# Patient Record
Sex: Female | Born: 1965 | Race: Black or African American | Hispanic: No | Smoking: Former smoker
Health system: Southern US, Community
[De-identification: ages and names within clinical notes are randomized; demographics above are authoritative.]

## PROBLEM LIST (undated history)

## (undated) DIAGNOSIS — E079 Disorder of thyroid, unspecified: Secondary | ICD-10-CM

## (undated) DIAGNOSIS — M1990 Unspecified osteoarthritis, unspecified site: Secondary | ICD-10-CM

## (undated) DIAGNOSIS — M797 Fibromyalgia: Secondary | ICD-10-CM

## (undated) DIAGNOSIS — E119 Type 2 diabetes mellitus without complications: Secondary | ICD-10-CM

## (undated) DIAGNOSIS — A4902 Methicillin resistant Staphylococcus aureus infection, unspecified site: Secondary | ICD-10-CM

## (undated) HISTORY — PX: CHOLECYSTECTOMY: SHX55

## (undated) HISTORY — PX: TONSILLECTOMY: SUR1361

## (undated) HISTORY — PX: APPENDECTOMY: SHX54

---

## 2005-12-26 ENCOUNTER — Ambulatory Visit (HOSPITAL_BASED_OUTPATIENT_CLINIC_OR_DEPARTMENT_OTHER): Admission: RE | Admit: 2005-12-26 | Discharge: 2005-12-26 | Payer: Self-pay | Admitting: Orthopaedic Surgery

## 2006-02-02 ENCOUNTER — Encounter: Admission: RE | Admit: 2006-02-02 | Discharge: 2006-02-02 | Payer: Self-pay | Admitting: Orthopaedic Surgery

## 2009-08-10 ENCOUNTER — Ambulatory Visit: Payer: Self-pay | Admitting: Diagnostic Radiology

## 2009-08-10 ENCOUNTER — Encounter: Payer: Self-pay | Admitting: Emergency Medicine

## 2009-08-11 ENCOUNTER — Inpatient Hospital Stay (HOSPITAL_COMMUNITY): Admission: EM | Admit: 2009-08-11 | Discharge: 2009-08-13 | Payer: Self-pay | Admitting: Internal Medicine

## 2009-08-11 ENCOUNTER — Encounter (INDEPENDENT_AMBULATORY_CARE_PROVIDER_SITE_OTHER): Payer: Self-pay | Admitting: Internal Medicine

## 2009-08-11 ENCOUNTER — Ambulatory Visit: Payer: Self-pay | Admitting: Cardiology

## 2009-08-11 ENCOUNTER — Ambulatory Visit: Payer: Self-pay | Admitting: Vascular Surgery

## 2010-02-20 ENCOUNTER — Encounter: Payer: Self-pay | Admitting: Orthopaedic Surgery

## 2010-04-17 LAB — COMPREHENSIVE METABOLIC PANEL
ALT: 18 U/L (ref 0–35)
AST: 22 U/L (ref 0–37)
Albumin: 3.7 g/dL (ref 3.5–5.2)
BUN: 12 mg/dL (ref 6–23)
CO2: 27 mEq/L (ref 19–32)
Calcium: 9 mg/dL (ref 8.4–10.5)
Creatinine, Ser: 0.83 mg/dL (ref 0.4–1.2)
GFR calc non Af Amer: >60 mL/min (ref >60)
Total Bilirubin: 0.2 mg/dL — ABNORMAL LOW (ref 0.3–1.2)
Total Protein: 6.7 g/dL (ref 6.0–8.3)

## 2010-04-17 LAB — CBC
HCT: 30.9 % — ABNORMAL LOW (ref 36.0–46.0)
HCT: 31.5 % — ABNORMAL LOW (ref 36.0–46.0)
Hemoglobin: 10.1 g/dL — ABNORMAL LOW (ref 12.0–15.0)
Hemoglobin: 10.7 g/dL — ABNORMAL LOW (ref 12.0–15.0)
MCH: 29.6 pg (ref 26.0–34.0)
MCHC: 32.5 g/dL (ref 30.0–36.0)
RDW: 13.6 % (ref 11.5–15.5)
WBC: 5.6 10*3/uL (ref 4.0–10.5)

## 2010-04-17 LAB — GLUCOSE, CAPILLARY
Glucose-Capillary: 104 mg/dL — ABNORMAL HIGH (ref 70–99)
Glucose-Capillary: 112 mg/dL — ABNORMAL HIGH (ref 70–99)

## 2010-04-17 LAB — BASIC METABOLIC PANEL
BUN: 14 mg/dL (ref 6–23)
CO2: 30 mEq/L (ref 19–32)
Creatinine, Ser: 0.9 mg/dL (ref 0.4–1.2)
GFR calc Af Amer: >60 mL/min (ref >60)
GFR calc non Af Amer: >60 mL/min (ref >60)

## 2010-04-17 LAB — LIPID PANEL
HDL: 45 mg/dL (ref >39)
LDL Cholesterol: 80 mg/dL (ref 0–99)
Total CHOL/HDL Ratio: 3.2 RATIO
Triglycerides: 101 mg/dL (ref <150)
VLDL: 20 mg/dL (ref 0–40)

## 2010-04-17 LAB — LUPUS ANTICOAGULANT PANEL
DRVVT: 43.1 secs (ref 36.2–44.3)
Lupus Anticoagulant: NOT DETECTED
PTT Lupus Anticoagulant: 40.7 secs (ref 30.0–45.6)

## 2010-04-17 LAB — CARDIAC PANEL(CRET KIN+CKTOT+MB+TROPI)
CK, MB: 1 ng/mL (ref 0.3–4.0)
Relative Index: INVALID (ref 0.0–2.5)
Total CK: 90 U/L (ref 7–177)
Troponin I: <0.01 ng/mL (ref 0.00–0.06)

## 2010-04-17 LAB — DIFFERENTIAL
Basophils Absolute: 0.1 10*3/uL (ref 0.0–0.1)
Basophils Relative: 1 % (ref 0–1)
Eosinophils Absolute: 0 10*3/uL (ref 0.0–0.7)
Eosinophils Relative: 0 % (ref 0–5)
Lymphocytes Relative: 33 % (ref 12–46)
Neutro Abs: 4.8 10*3/uL (ref 1.7–7.7)
Neutrophils Relative %: 60 % (ref 43–77)

## 2010-04-17 LAB — POCT TOXICOLOGY PANEL

## 2010-04-17 LAB — URINALYSIS, ROUTINE W REFLEX MICROSCOPIC
Glucose, UA: NEGATIVE mg/dL
Nitrite: NEGATIVE
Protein, ur: NEGATIVE mg/dL
Urobilinogen, UA: 1 mg/dL (ref 0.0–1.0)
pH: 7 (ref 5.0–8.0)

## 2010-04-17 LAB — ETHANOL
Alcohol, Ethyl (B): <10 mg/dL (ref 0–10)
Alcohol, Ethyl (B): <5 mg/dL (ref 0–10)

## 2010-04-17 LAB — PROTIME-INR
INR: 1.03 (ref 0.00–1.49)
Prothrombin Time: 13.2 seconds (ref 11.6–15.2)

## 2010-04-17 LAB — HEMOGLOBIN A1C: Mean Plasma Glucose: 111 mg/dL (ref <117)

## 2010-04-17 LAB — POCT CARDIAC MARKERS
CKMB, poc: <1 ng/mL — ABNORMAL LOW (ref 1.0–8.0)
Myoglobin, poc: 35.9 ng/mL (ref 12–200)

## 2010-04-17 LAB — FOLATE RBC: RBC Folate: 549 ng/mL (ref 180–600)

## 2010-04-17 LAB — RPR: RPR Ser Ql: NONREACTIVE

## 2010-04-17 LAB — APTT
aPTT: 32 seconds (ref 24–37)
aPTT: 32 seconds (ref 24–37)

## 2010-04-17 LAB — PROTEIN C ACTIVITY: Protein C Activity: 185 % — ABNORMAL HIGH (ref 75–133)

## 2010-04-17 LAB — PROTEIN C, TOTAL: Protein C, Total: 110 % (ref 70–140)

## 2010-04-17 LAB — ANTITHROMBIN III: AntiThromb III Func: 95 % (ref 76–126)

## 2010-04-17 LAB — FERRITIN: Ferritin: 86 ng/mL (ref 10–291)

## 2010-04-17 LAB — VITAMIN B12: Vitamin B-12: 311 pg/mL (ref 211–911)

## 2010-04-17 LAB — PROTEIN S ACTIVITY: Protein S Activity: 72 % (ref 69–129)

## 2010-04-17 LAB — BETA-2-GLYCOPROTEIN I ABS, IGG/M/A: Beta-2-Glycoprotein I IgM: 0 M Units (ref <20)

## 2010-04-17 LAB — HIV ANTIBODY (ROUTINE TESTING W REFLEX): HIV: NONREACTIVE

## 2010-04-17 LAB — FACTOR 5 LEIDEN

## 2010-04-17 LAB — HOMOCYSTEINE: Homocysteine: 8.1 umol/L (ref 4.0–15.4)

## 2010-12-04 ENCOUNTER — Encounter: Payer: Self-pay | Admitting: *Deleted

## 2010-12-04 ENCOUNTER — Emergency Department (HOSPITAL_BASED_OUTPATIENT_CLINIC_OR_DEPARTMENT_OTHER)
Admission: EM | Admit: 2010-12-04 | Discharge: 2010-12-04 | Disposition: A | Discharge status: Home / Self Care | Payer: Medicaid Other | Attending: Emergency Medicine | Admitting: Emergency Medicine

## 2010-12-04 DIAGNOSIS — L02219 Cutaneous abscess of trunk, unspecified: Secondary | ICD-10-CM | POA: Insufficient documentation

## 2010-12-04 DIAGNOSIS — L02213 Cutaneous abscess of chest wall: Secondary | ICD-10-CM

## 2010-12-04 DIAGNOSIS — IMO0001 Reserved for inherently not codable concepts without codable children: Secondary | ICD-10-CM | POA: Insufficient documentation

## 2010-12-04 DIAGNOSIS — E079 Disorder of thyroid, unspecified: Secondary | ICD-10-CM | POA: Insufficient documentation

## 2010-12-04 DIAGNOSIS — Z8739 Personal history of other diseases of the musculoskeletal system and connective tissue: Secondary | ICD-10-CM | POA: Insufficient documentation

## 2010-12-04 DIAGNOSIS — L03319 Cellulitis of trunk, unspecified: Secondary | ICD-10-CM | POA: Insufficient documentation

## 2010-12-04 DIAGNOSIS — Z79899 Other long term (current) drug therapy: Secondary | ICD-10-CM | POA: Insufficient documentation

## 2010-12-04 HISTORY — DX: Disorder of thyroid, unspecified: E07.9

## 2010-12-04 HISTORY — DX: Unspecified osteoarthritis, unspecified site: M19.90

## 2010-12-04 HISTORY — DX: Fibromyalgia: M79.7

## 2010-12-04 MED ORDER — OXYCODONE-ACETAMINOPHEN 5-325 MG PO TABS
2.0000 | ORAL_TABLET | Freq: Once | ORAL | Status: AC
  Administered 2010-12-04: 2 via ORAL
  Filled 2010-12-04: qty 2

## 2010-12-04 MED ORDER — OXYCODONE-ACETAMINOPHEN 5-325 MG PO TABS: 2.0000 | ORAL_TABLET | ORAL | Status: AC | PRN

## 2010-12-04 NOTE — ED Notes (Signed)
Pt states she noticed a bump in her left axillary region on Monday which gradually got bigger. Saw Dr. And was placed on Avelox. Told if not better to see PCP which she did on Friday and they lanced it. MRSA culture done. Now has increased pain and swelling

## 2010-12-04 NOTE — ED Provider Notes (Signed)
History     CSN: [id number] Arrival date & time: 12/04/2010  9:25 PM   First MD Initiated Contact with Patient 12/04/10 2157      Chief Complaint  Patient presents with  . Abscess    (Consider location/radiation/quality/duration/timing/severity/associated sxs/prior treatment) Patient is a 45 y.o. female presenting with abscess. The history is provided by the patient. No language interpreter was used.  Abscess  This is a recurrent problem. The problem occurs frequently. The problem has been gradually worsening. The abscess is present on the back. The problem is moderate. The abscess is characterized by redness and painfulness. The abscess first occurred at home. Her past medical history is significant for skin abscesses in family. There were no sick contacts.  Pt reports she saw her MD on Friday and had abscess drained.  She reports area has swollen up again.     Past Medical History  Diagnosis Date  . Thyroid disease   . Gout   . Arthritis   . Fibromyalgia     Past Surgical History  Procedure Date  . Appendectomy   . Cholecystectomy   . Tonsillectomy     History reviewed. No pertinent family history.  History  Substance Use Topics  . Smoking status: Never Smoker   . Smokeless tobacco: Not on file  . Alcohol Use: No    OB History    Grav Para Term Preterm Abortions TAB SAB Ect Mult Living                  Review of Systems  Skin: Positive for wound.  All other systems reviewed and are negative.    Allergies  Ultracet and Ultram  Home Medications   Current Outpatient Rx  Name Route Sig Dispense Refill  . AMLODIPINE BESYLATE PO Oral Take 1 tablet by mouth daily.      . CYCLOBENZAPRINE HCL 10 MG PO TABS Oral Take 10 mg by mouth 3 (three) times daily as needed. For muscle spasms      . DULOXETINE HCL 60 MG PO CPEP Oral Take 60 mg by mouth daily.      Marland Kitchen FERROUS SULFATE 325 (65 FE) MG PO TABS Oral Take 325 mg by mouth daily.      Marland Kitchen LASIX PO Oral Take 1  tablet by mouth daily.      Marland Kitchen HYDROCODONE-ACETAMINOPHEN 5-500 MG PO TABS Oral Take 1 tablet by mouth every 4 (four) hours as needed. For pain      . LANSOPRAZOLE 30 MG PO CPDR Oral Take 60 mg by mouth daily.      Marland Kitchen LEVOTHYROXINE SODIUM 175 MCG PO TABS Oral Take 175 mcg by mouth daily.      Marland Kitchen METOCLOPRAMIDE HCL 10 MG PO TABS Oral Take 10 mg by mouth daily.      Marland Kitchen PHENTERMINE HCL 37.5 MG PO CAPS Oral Take by mouth every morning.      Marland Kitchen POTASSIUM CHLORIDE CR PO Oral Take 1 tablet by mouth daily.      Marland Kitchen PROBENECID 500 MG PO TABS Oral Take 500 mg by mouth 2 (two) times daily.      . SULFAMETHOXAZOLE-TMP DS 800-160 MG PO TABS Oral Take 1 tablet by mouth 2 (two) times daily.      . ALBUTEROL SULFATE HFA 108 (90 BASE) MCG/ACT IN AERS Inhalation Inhale 2 puffs into the lungs every 6 (six) hours as needed. For wheezing or shortness of breath       BP 117/62  Pulse  92  Temp(Src) 99.2 F (37.3 C) (Oral)  Resp 20  Ht 5\' 6"  (1.676 m)  Wt 236 lb (107.049 kg)  BMI 38.09 kg/m2  SpO2 100%  Physical Exam  Nursing note and vitals reviewed. Constitutional: She appears well-developed and well-nourished.  HENT:  Head: Normocephalic.  Skin: Skin is warm. There is erythema.  Psychiatric: She has a normal mood and affect.  12x15 cm area of erythema   ED Course  Procedures (including critical care time)  Labs Reviewed - No data to display No results found.   No diagnosis found.    MDM   Pt refuses I and D.  Pt reports she wants to be put to sleep in order to have drained.  I advised pt I can refer her to surgeon.  (probably is a good idea given first I and D incision has closed)       Langston Masker, [place] 12/04/10 2300

## 2010-12-05 NOTE — ED Provider Notes (Signed)
Medical screening examination/treatment/procedure(s) were performed by non-physician practitioner and as supervising physician I was immediately available for consultation/collaboration.   Toy Baker, MD 12/05/10 [phone]

## 2011-03-11 ENCOUNTER — Emergency Department (HOSPITAL_COMMUNITY): Payer: Medicaid Other

## 2011-03-11 ENCOUNTER — Encounter (HOSPITAL_COMMUNITY): Payer: Self-pay

## 2011-03-11 ENCOUNTER — Emergency Department (HOSPITAL_COMMUNITY)
Admission: EM | Admit: 2011-03-11 | Discharge: 2011-03-12 | Disposition: A | Discharge status: Home / Self Care | Payer: Medicaid Other | Attending: Emergency Medicine | Admitting: Emergency Medicine

## 2011-03-11 DIAGNOSIS — Z862 Personal history of diseases of the blood and blood-forming organs and certain disorders involving the immune mechanism: Secondary | ICD-10-CM | POA: Insufficient documentation

## 2011-03-11 DIAGNOSIS — M545 Low back pain, unspecified: Secondary | ICD-10-CM | POA: Insufficient documentation

## 2011-03-11 DIAGNOSIS — Z79899 Other long term (current) drug therapy: Secondary | ICD-10-CM | POA: Insufficient documentation

## 2011-03-11 DIAGNOSIS — M129 Arthropathy, unspecified: Secondary | ICD-10-CM | POA: Insufficient documentation

## 2011-03-11 DIAGNOSIS — M25559 Pain in unspecified hip: Secondary | ICD-10-CM | POA: Insufficient documentation

## 2011-03-11 DIAGNOSIS — IMO0001 Reserved for inherently not codable concepts without codable children: Secondary | ICD-10-CM | POA: Insufficient documentation

## 2011-03-11 DIAGNOSIS — Z8639 Personal history of other endocrine, nutritional and metabolic disease: Secondary | ICD-10-CM | POA: Insufficient documentation

## 2011-03-11 DIAGNOSIS — M549 Dorsalgia, unspecified: Secondary | ICD-10-CM

## 2011-03-11 DIAGNOSIS — E079 Disorder of thyroid, unspecified: Secondary | ICD-10-CM | POA: Insufficient documentation

## 2011-03-11 MED ORDER — HYDROMORPHONE HCL PF 1 MG/ML IJ SOLN
1.0000 mg | Freq: Once | INTRAMUSCULAR | Status: AC
  Administered 2011-03-12: 1 mg via INTRAMUSCULAR
  Filled 2011-03-11: qty 1

## 2011-03-11 MED ORDER — CYCLOBENZAPRINE HCL 10 MG PO TABS
5.0000 mg | ORAL_TABLET | Freq: Once | ORAL | Status: AC
  Administered 2011-03-12: 5 mg via ORAL
  Filled 2011-03-11: qty 1

## 2011-03-11 NOTE — ED Notes (Signed)
Pt c/o back pain after catching her friend - her friend is approx 170 lbs

## 2011-03-12 MED ORDER — CYCLOBENZAPRINE HCL 10 MG PO TABS: 10.0000 mg | ORAL_TABLET | Freq: Two times a day (BID) | ORAL | Status: AC | PRN

## 2011-03-12 MED ORDER — IBUPROFEN 800 MG PO TABS: 800.0000 mg | ORAL_TABLET | Freq: Three times a day (TID) | ORAL | Status: AC

## 2011-03-12 NOTE — ED Provider Notes (Signed)
History     CSN: [id number]  Arrival date & time 03/11/11  2228   First MD Initiated Contact with Patient 03/11/11 2305      Chief Complaint  Patient presents with  . Back Pain    (Consider location/radiation/quality/duration/timing/severity/associated sxs/prior treatment) Patient is a 46 y.o. female presenting with back pain. The history is provided by the patient.  Back Pain  This is a new problem. The current episode started 1 to 2 hours ago. The problem occurs constantly. The problem has not changed since onset.Associated with: Is walking with a friend walked into a rail she caught her friend injuring her right lower back. No fall. No weakness or numbness. She does have pain that radiates to her right hip area. The pain is present in the lumbar spine. The quality of the pain is described as stabbing. The pain radiates to the right thigh. The pain is the same all the time. Pertinent negatives include no chest pain, no fever, no numbness, no weight loss, no headaches, no abdominal pain, no bowel incontinence, no perianal numbness, no bladder incontinence, no dysuria, no paresthesias, no paresis, no tingling and no weakness. She has tried nothing for the symptoms.   patient is able to walk okay, though hurts to walk. She has a history of back pain and fibromyalgia. She states she's had symptoms like this in the remote past. Moderate in severity. No other complaints.  Past Medical History  Diagnosis Date  . Thyroid disease   . Gout   . Arthritis   . Fibromyalgia     Past Surgical History  Procedure Date  . Appendectomy   . Cholecystectomy   . Tonsillectomy     No family history on file.  History  Substance Use Topics  . Smoking status: Never Smoker   . Smokeless tobacco: Not on file  . Alcohol Use: No    OB History    Grav Para Term Preterm Abortions TAB SAB Ect Mult Living                  Review of Systems  Constitutional: Negative for fever, chills and weight  loss.  HENT: Negative for neck pain and neck stiffness.   Eyes: Negative for pain.  Respiratory: Negative for shortness of breath.   Cardiovascular: Negative for chest pain.  Gastrointestinal: Negative for abdominal pain and bowel incontinence.  Genitourinary: Negative for bladder incontinence and dysuria.  Musculoskeletal: Positive for back pain. Negative for gait problem.  Skin: Negative for rash.  Neurological: Negative for tingling, weakness, numbness, headaches and paresthesias.  All other systems reviewed and are negative.    Allergies  Ultracet and Ultram  Home Medications   Current Outpatient Rx  Name Route Sig Dispense Refill  . ALBUTEROL SULFATE HFA 108 (90 BASE) MCG/ACT IN AERS Inhalation Inhale 2 puffs into the lungs every 6 (six) hours as needed. For wheezing or shortness of breath     . CYCLOBENZAPRINE HCL 10 MG PO TABS Oral Take 10 mg by mouth 3 (three) times daily as needed. For muscle spasms      . DULOXETINE HCL 60 MG PO CPEP Oral Take 60 mg by mouth daily.      Marland Kitchen FERROUS SULFATE 325 (65 FE) MG PO TABS Oral Take 325 mg by mouth daily.      Marland Kitchen LANSOPRAZOLE 30 MG PO CPDR Oral Take 60 mg by mouth daily.      Marland Kitchen LEVOTHYROXINE SODIUM 175 MCG PO TABS Oral Take 175  mcg by mouth daily.      Marland Kitchen METOCLOPRAMIDE HCL 10 MG PO TABS Oral Take 10 mg by mouth daily.      Marland Kitchen PHENTERMINE HCL 37.5 MG PO CAPS Oral Take 37.5 mg by mouth every morning.     Marland Kitchen PROBENECID 500 MG PO TABS Oral Take 500 mg by mouth 2 (two) times daily.        BP 136/84  Pulse 83  Temp(Src) 98.6 F (37 C) (Oral)  Resp 18  SpO2 98%  Physical Exam  Constitutional: She is oriented to person, place, and time. She appears well-developed and well-nourished.  HENT:  Head: Normocephalic and atraumatic.  Eyes: Conjunctivae and EOM are normal. Pupils are equal, round, and reactive to light.  Neck: Trachea normal. Neck supple. No thyromegaly present.  Cardiovascular: Normal rate, regular rhythm, S1 normal, S2  normal and normal pulses.     No systolic murmur is present   No diastolic murmur is present  Pulses:      Radial pulses are 2+ on the right side, and 2+ on the left side.  Pulmonary/Chest: Effort normal and breath sounds normal. She has no wheezes. She has no rhonchi. She has no rales. She exhibits no tenderness.  Abdominal: Soft. Normal appearance and bowel sounds are normal. There is no tenderness. There is no CVA tenderness and negative Murphy's sign.  Musculoskeletal:       No midline deformity or step off. Mild lower lumbar tenderness and right paralumbar tenderness. No lower extremity deficits with equal DTRs, strengths and sensorium to light touch.  BLE:s Calves nontender, no cords or erythema, negative Homans sign  Neurological: She is alert and oriented to person, place, and time. She has normal strength. No cranial nerve deficit or sensory deficit. GCS eye subscore is 4. GCS verbal subscore is 5. GCS motor subscore is 6.  Skin: Skin is warm and dry. No rash noted. She is not diaphoretic.  Psychiatric: Her speech is normal.       Cooperative and appropriate    ED Course  Procedures (including critical care time)  Labs Reviewed - No data to display Dg Lumbar Spine Complete  03/12/2011  *RADIOLOGY REPORT*  Clinical Data: Lower back pain after injury.  History of cortisone injections for back pain.  LUMBAR SPINE - COMPLETE 4+ VIEW  Comparison: None.  Findings: There are five non-rib bearing vertebral bodies.  There is normal alignment.  There is no evidence for acute fracture or subluxation.  No spondylolysis or spondylolisthesis identified. No significant degenerative changes identified. The visualized portion of the pelvis has a normal appearance.  Visualized bowel gas pattern is nonobstructive.  IMPRESSION: Negative exam.  Original Report Authenticated By: Patterson Hammersmith, M.D.    IM Dilaudid. Ice.  Recheck 12:40 AM improved, no change on normal neuro exam.   MDM   Right  lower back pain likely musculoskeletal. Patient has primary care followup as needed and states understanding all discharge and followup instructions. Back pain precautions verbalized is understood. No indication for emergent MRI at this time.        Sunnie Nielsen, MD 03/12/11 [phone]

## 2011-03-12 NOTE — ED Notes (Signed)
Patient getting dressed and ready for discharge.

## 2011-08-01 ENCOUNTER — Other Ambulatory Visit (HOSPITAL_BASED_OUTPATIENT_CLINIC_OR_DEPARTMENT_OTHER): Payer: Self-pay | Admitting: Nurse Practitioner

## 2011-08-01 DIAGNOSIS — R52 Pain, unspecified: Secondary | ICD-10-CM

## 2011-08-05 ENCOUNTER — Ambulatory Visit (HOSPITAL_BASED_OUTPATIENT_CLINIC_OR_DEPARTMENT_OTHER)
Admission: RE | Admit: 2011-08-05 | Discharge: 2011-08-05 | Disposition: A | Discharge status: Home / Self Care | Payer: Medicaid Other | Source: Ambulatory Visit | Attending: Nurse Practitioner | Admitting: Nurse Practitioner

## 2011-08-05 ENCOUNTER — Inpatient Hospital Stay (HOSPITAL_BASED_OUTPATIENT_CLINIC_OR_DEPARTMENT_OTHER): Admission: RE | Admit: 2011-08-05 | Payer: Medicaid Other | Source: Ambulatory Visit

## 2011-08-05 DIAGNOSIS — M25569 Pain in unspecified knee: Secondary | ICD-10-CM | POA: Insufficient documentation

## 2011-08-05 DIAGNOSIS — R52 Pain, unspecified: Secondary | ICD-10-CM | POA: Insufficient documentation

## 2011-08-05 DIAGNOSIS — M25469 Effusion, unspecified knee: Secondary | ICD-10-CM | POA: Insufficient documentation

## 2011-08-05 DIAGNOSIS — M169 Osteoarthritis of hip, unspecified: Secondary | ICD-10-CM | POA: Insufficient documentation

## 2011-08-05 DIAGNOSIS — M1610 Unilateral primary osteoarthritis, unspecified hip: Secondary | ICD-10-CM | POA: Insufficient documentation

## 2011-09-23 ENCOUNTER — Emergency Department (HOSPITAL_BASED_OUTPATIENT_CLINIC_OR_DEPARTMENT_OTHER)
Admission: EM | Admit: 2011-09-23 | Discharge: 2011-09-24 | Disposition: A | Discharge status: Home / Self Care | Payer: Medicaid Other | Attending: Emergency Medicine | Admitting: Emergency Medicine

## 2011-09-23 ENCOUNTER — Encounter (HOSPITAL_BASED_OUTPATIENT_CLINIC_OR_DEPARTMENT_OTHER): Payer: Self-pay | Admitting: *Deleted

## 2011-09-23 DIAGNOSIS — M542 Cervicalgia: Secondary | ICD-10-CM | POA: Insufficient documentation

## 2011-09-23 DIAGNOSIS — Z9089 Acquired absence of other organs: Secondary | ICD-10-CM | POA: Insufficient documentation

## 2011-09-23 DIAGNOSIS — T07XXXA Unspecified multiple injuries, initial encounter: Secondary | ICD-10-CM

## 2011-09-23 DIAGNOSIS — Y92009 Unspecified place in unspecified non-institutional (private) residence as the place of occurrence of the external cause: Secondary | ICD-10-CM | POA: Insufficient documentation

## 2011-09-23 DIAGNOSIS — M109 Gout, unspecified: Secondary | ICD-10-CM | POA: Insufficient documentation

## 2011-09-23 DIAGNOSIS — Z79899 Other long term (current) drug therapy: Secondary | ICD-10-CM | POA: Insufficient documentation

## 2011-09-23 DIAGNOSIS — W19XXXA Unspecified fall, initial encounter: Secondary | ICD-10-CM | POA: Insufficient documentation

## 2011-09-23 DIAGNOSIS — R55 Syncope and collapse: Secondary | ICD-10-CM | POA: Insufficient documentation

## 2011-09-23 DIAGNOSIS — R51 Headache: Secondary | ICD-10-CM | POA: Insufficient documentation

## 2011-09-23 DIAGNOSIS — Z8739 Personal history of other diseases of the musculoskeletal system and connective tissue: Secondary | ICD-10-CM | POA: Insufficient documentation

## 2011-09-23 DIAGNOSIS — E079 Disorder of thyroid, unspecified: Secondary | ICD-10-CM | POA: Insufficient documentation

## 2011-09-23 DIAGNOSIS — R079 Chest pain, unspecified: Secondary | ICD-10-CM | POA: Insufficient documentation

## 2011-09-23 DIAGNOSIS — IMO0001 Reserved for inherently not codable concepts without codable children: Secondary | ICD-10-CM | POA: Insufficient documentation

## 2011-09-23 LAB — CBC WITH DIFFERENTIAL/PLATELET
Basophils Relative: 0 % (ref 0–1)
HCT: 30.3 % — ABNORMAL LOW (ref 36.0–46.0)
Hemoglobin: 10 g/dL — ABNORMAL LOW (ref 12.0–15.0)
Lymphocytes Relative: 47 % — ABNORMAL HIGH (ref 12–46)
Lymphs Abs: 3 10*3/uL (ref 0.7–4.0)
MCHC: 33 g/dL (ref 30.0–36.0)
Monocytes Absolute: 0.6 10*3/uL (ref 0.1–1.0)
Monocytes Relative: 9 % (ref 3–12)
Neutro Abs: 2.9 10*3/uL (ref 1.7–7.7)
Neutrophils Relative %: 44 % (ref 43–77)
RBC: 3.56 MIL/uL — ABNORMAL LOW (ref 3.87–5.11)
WBC: 6.5 10*3/uL (ref 4.0–10.5)

## 2011-09-23 LAB — BASIC METABOLIC PANEL
BUN: 18 mg/dL (ref 6–23)
CO2: 25 mEq/L (ref 19–32)
Chloride: 103 mEq/L (ref 96–112)
Creatinine, Ser: 0.8 mg/dL (ref 0.50–1.10)
GFR calc Af Amer: >90 mL/min (ref >90)
Glucose, Bld: 100 mg/dL — ABNORMAL HIGH (ref 70–99)
Potassium: 3.2 mEq/L — ABNORMAL LOW (ref 3.5–5.1)

## 2011-09-23 NOTE — ED Provider Notes (Signed)
History  This chart was scribed for Kristine Seamen, MD by Kristine Ramos. This patient was seen in room MH01/MH01 and the patient's care was started at 2225.   CSN: [id number]  Arrival date & time 09/23/11  2225   First MD Initiated Contact with Patient 09/23/11 2311      Chief Complaint  Patient presents with  . Syncope    Patient is a 46 y.o. female presenting with syncope. The history is provided by the patient. No language interpreter was used.  Loss of Consciousness This is a new problem. The current episode started 6 to 12 hours ago. The problem has been resolved. Associated symptoms include chest pain. Pertinent negatives include no abdominal pain, no headaches and no shortness of breath. Nothing aggravates the symptoms. Nothing relieves the symptoms. She has tried nothing (C-collar in place.) for the symptoms.   Kristine Ramos is a 46 y.o. female who presents to the Emergency Department complaining of syncopal episode this PM about 6 hours ago. She does not remember passing out and woke up on the kitchen floor after standing up from watching TV. She states left side of her neck, head and chest are painful. She states had a similar syncopal episode years ago. She denies any emesis today but did feel sick two days ago with nausea, emesis and diarrhea and now feels a little dehydrated.  Past Medical History  Diagnosis Date  . Thyroid disease   . Gout   . Arthritis   . Fibromyalgia     Past Surgical History  Procedure Date  . Appendectomy   . Cholecystectomy   . Tonsillectomy     History reviewed. No pertinent family history.  History  Substance Use Topics  . Smoking status: Never Smoker   . Smokeless tobacco: Not on file  . Alcohol Use: No    OB History    Grav Para Term Preterm Abortions TAB SAB Ect Mult Living                  Review of Systems  Constitutional: Negative for fever and chills.  HENT: Positive for neck pain (Left neck pain. ).   Respiratory:  Negative for shortness of breath.   Cardiovascular: Positive for chest pain and syncope.  Gastrointestinal: Negative for nausea, vomiting and abdominal pain.  Musculoskeletal: Negative for back pain.       Left shoulder pain.   Neurological: Positive for syncope. Negative for weakness and headaches.  All other systems reviewed and are negative.    Allergies  Almond oil; Apple; Avocado; Banana; Cabbage; Carrot; Eggs or egg-derived products; Milk-related compounds; Orange fruit; Peach; Shellfish allergy; Strawberry; Tomato; Yeast-related products; Ultracet; and Ultram  Home Medications   Current Outpatient Rx  Name Route Sig Dispense Refill  . ALBUTEROL SULFATE HFA 108 (90 BASE) MCG/ACT IN AERS Inhalation Inhale 2 puffs into the lungs every 6 (six) hours as needed. For wheezing or shortness of breath     . CYCLOBENZAPRINE HCL 10 MG PO TABS Oral Take 10 mg by mouth 3 (three) times daily as needed. For muscle spasms      . DULOXETINE HCL 60 MG PO CPEP Oral Take 60 mg by mouth daily.      Marland Kitchen EPINEPHRINE 0.3 MG/0.3ML IJ DEVI Intramuscular Inject 0.3 mg into the muscle once.    [name] (65 FE) MG PO TABS Oral Take 325 mg by mouth daily.      Marland Kitchen LANSOPRAZOLE 30 MG PO CPDR Oral  Take 60 mg by mouth daily.      Marland Kitchen LEVOTHYROXINE SODIUM 175 MCG PO TABS Oral Take 175 mcg by mouth daily.      Marland Kitchen METOCLOPRAMIDE HCL 10 MG PO TABS Oral Take 10 mg by mouth daily.      Marland Kitchen PHENTERMINE HCL 37.5 MG PO TABS Oral Take 37.5 mg by mouth daily before breakfast.    . PROBENECID 500 MG PO TABS Oral Take 500 mg by mouth 2 (two) times daily.        Triage Vitals: BP 119/71  Pulse 68  Temp 99.1 F (37.3 C) (Oral)  Resp 20  Ht 5\' 6"  (1.676 m)  Wt 239 lb (108.41 kg)  BMI 38.58 kg/m2  SpO2 99%  Physical Exam  Nursing note and vitals reviewed. Constitutional: She is oriented to person, place, and time. She appears well-developed and well-nourished. No distress.  HENT:  Head: Normocephalic and  atraumatic.  Eyes: Conjunctivae and EOM are normal. Pupils are equal, round, and reactive to light.  Neck: Neck supple. No tracheal deviation present.       C-spine tenderness. C-collar in place.  Cardiovascular: Normal rate, regular rhythm and normal heart sounds.   Pulmonary/Chest: Effort normal and breath sounds normal. No respiratory distress. She has no wheezes. She has no rales. She exhibits tenderness (Left CW tenderness without any crepitance. ).  Abdominal: Soft. Bowel sounds are normal. She exhibits no distension. There is no tenderness. There is no rebound and no guarding.  Musculoskeletal: Normal range of motion. She exhibits tenderness (Left shoulder tenderness without deformity. ). She exhibits no edema.  Neurological: She is alert and oriented to person, place, and time.  Skin: Skin is warm and dry.       Partially avulsed nail right fifth toe.  Psychiatric: She has a normal mood and affect. Her behavior is normal.    ED Course  Procedures (including critical care time) DIAGNOSTIC STUDIES: Oxygen Saturation is 99% on room air, normal by my interpretation.    COORDINATION OF CARE: At 1120 PM Discussed treatment plan with patient which includes EKG, blood work, UA, and heart markers. Patient agrees.      MDM  I personally performed the services described in this documentation, which was scribed in my presence.  The recorded information has been reviewed and considered.  Nursing notes and vitals signs, including pulse oximetry, reviewed.  Summary of this visit's results, reviewed by myself:  Labs:  Results for orders placed during the hospital encounter of 09/23/11  URINALYSIS, ROUTINE W REFLEX MICROSCOPIC      Component Value Range   Color, Urine YELLOW  YELLOW   APPearance CLEAR  CLEAR   Specific Gravity, Urine 1.025  1.005 - 1.030   pH 5.5  5.0 - 8.0   Glucose, UA NEGATIVE  NEGATIVE mg/dL   Hgb urine dipstick NEGATIVE  NEGATIVE   Bilirubin Urine NEGATIVE   NEGATIVE   Ketones, ur NEGATIVE  NEGATIVE mg/dL   Protein, ur NEGATIVE  NEGATIVE mg/dL   Urobilinogen, UA 1.0  0.0 - 1.0 mg/dL   Nitrite NEGATIVE  NEGATIVE   Leukocytes, UA TRACE (*) NEGATIVE  CBC WITH DIFFERENTIAL      Component Value Range   WBC 6.5  4.0 - 10.5 K/uL   RBC 3.56 (*) 3.87 - 5.11 MIL/uL   Hemoglobin 10.0 (*) 12.0 - 15.0 g/dL   HCT 45.4 (*) 09.8 - 11.9 %   MCV 85.1  78.0 - 100.0 fL   MCH 28.1  26.0 - 34.0 pg   MCHC 33.0  30.0 - 36.0 g/dL   RDW 30.8  65.7 - 84.6 %   Platelets 261  150 - 400 K/uL   Neutrophils Relative 44  43 - 77 %   Neutro Abs 2.9  1.7 - 7.7 K/uL   Lymphocytes Relative 47 (*) 12 - 46 %   Lymphs Abs 3.0  0.7 - 4.0 K/uL   Monocytes Relative 9  3 - 12 %   Monocytes Absolute 0.6  0.1 - 1.0 K/uL   Eosinophils Relative 1  0 - 5 %   Eosinophils Absolute 0.0  0.0 - 0.7 K/uL   Basophils Relative 0  0 - 1 %   Basophils Absolute 0.0  0.0 - 0.1 K/uL  BASIC METABOLIC PANEL      Component Value Range   Sodium 140  135 - 145 mEq/L   Potassium 3.2 (*) 3.5 - 5.1 mEq/L   Chloride 103  96 - 112 mEq/L   CO2 25  19 - 32 mEq/L   Glucose, Bld 100 (*) 70 - 99 mg/dL   BUN 18  6 - 23 mg/dL   Creatinine, Ser 9.62  0.50 - 1.10 mg/dL   Calcium 9.4  8.4 - 95.2 mg/dL   GFR calc non Af Amer 86 (*) >90 mL/min   GFR calc Af Amer >90  >90 mL/min  TROPONIN I      Component Value Range   Troponin I <0.30  <0.30 ng/mL  URINE MICROSCOPIC-ADD ON      Component Value Range   Squamous Epithelial / LPF FEW (*) RARE   WBC, UA 3-6  <3 WBC/hpf   RBC / HPF 0-2  <3 RBC/hpf   Bacteria, UA FEW (*) RARE  TROPONIN I      Component Value Range   Troponin I <0.30  <0.30 ng/mL    Imaging Studies: Dg Cervical Spine Complete  09/24/2011  *RADIOLOGY REPORT*  Clinical Data: 46 year old female with syncope.  Pain.  CERVICAL SPINE - COMPLETE 4+ VIEW  Comparison: Cervical MRI 07/25/2007.  Findings: Stable reversal of cervical lordosis.  Prevertebral soft tissue contours remain within normal  limits. Cervicothoracic junction alignment is within normal limits.  Stable disc spaces. Bilateral posterior element alignment is within normal limits.  AP alignment and lung apices within normal limits.  C1-C2 alignment and odontoid within normal limits.  IMPRESSION: Stable. No acute fracture or listhesis identified in the cervical spine.  Ligamentous injury is not excluded.   Original Report Authenticated By: Harley Hallmark, M.D.    Ct Head Wo Contrast  09/24/2011  *RADIOLOGY REPORT*  Clinical Data: 46 year old female syncope.  Pain.  CT HEAD WITHOUT CONTRAST  Technique:  Contiguous axial images were obtained from the base of the skull through the vertex without contrast.  Comparison: Brain MRI 08/11/2009.  Findings: Visualized paranasal sinuses and mastoids are clear. Visualized orbits and scalp soft tissues are within normal limits. No acute osseous abnormality identified.  Cerebral volume is within normal limits for age.  No midline shift, ventriculomegaly, mass effect, evidence of mass lesion, intracranial hemorrhage or evidence of cortically based acute infarction.  Gray-white matter differentiation is within normal limits throughout the brain.  No suspicious intracranial vascular hyperdensity.  IMPRESSION: Stable and normal noncontrast CT appearance of the brain.   Original Report Authenticated By: Harley Hallmark, M.D.      Date: 09/23/2011 10:48 PM  Rate: 70  Rhythm: normal sinus rhythm  QRS Axis: normal  Intervals: normal  ST/T Wave abnormalities: normal  Conduction Disutrbances: none  Narrative Interpretation: LVH  Comparison with previous EKG: none available  2:48 AM Patient has been stable in the ED. No evidence of arrhythmia or other cardiac event. Patient has a primary care physician within she can followup.             Kristine Seamen, MD 09/24/11 [phone]

## 2011-09-23 NOTE — ED Notes (Addendum)
Pt states she was fine earlier today. Was sitting on couch watching TV. Got up to go to the kitchen and woke up on the floor. Now c/o left shoulder, face, neck,head and CP with deep inspiration. C-collar applied at triage. Taken to ED1 in wheelchair.

## 2011-09-24 ENCOUNTER — Emergency Department (HOSPITAL_BASED_OUTPATIENT_CLINIC_OR_DEPARTMENT_OTHER): Payer: Medicaid Other

## 2011-09-24 LAB — URINALYSIS, ROUTINE W REFLEX MICROSCOPIC
Glucose, UA: NEGATIVE mg/dL
Ketones, ur: NEGATIVE mg/dL
Nitrite: NEGATIVE
Specific Gravity, Urine: 1.025 (ref 1.005–1.030)
pH: 5.5 (ref 5.0–8.0)

## 2011-09-24 LAB — URINE MICROSCOPIC-ADD ON

## 2011-09-24 MED ORDER — SODIUM CHLORIDE 0.9 % IV BOLUS (SEPSIS)
1000.0000 mL | Freq: Once | INTRAVENOUS | Status: AC
  Administered 2011-09-24: 1000 mL via INTRAVENOUS

## 2011-09-24 MED ORDER — FENTANYL CITRATE 0.05 MG/ML IJ SOLN
100.0000 ug | Freq: Once | INTRAMUSCULAR | Status: AC
  Administered 2011-09-24: 100 ug via INTRAVENOUS
  Filled 2011-09-24: qty 2

## 2011-09-24 MED ORDER — HYDROCODONE-ACETAMINOPHEN 5-325 MG PO TABS: 1.0000 | ORAL_TABLET | Freq: Four times a day (QID) | ORAL | Status: AC | PRN

## 2011-10-24 ENCOUNTER — Emergency Department (HOSPITAL_BASED_OUTPATIENT_CLINIC_OR_DEPARTMENT_OTHER)
Admission: EM | Admit: 2011-10-24 | Discharge: 2011-10-24 | Disposition: A | Discharge status: Home / Self Care | Payer: Medicaid Other | Attending: Emergency Medicine | Admitting: Emergency Medicine

## 2011-10-24 ENCOUNTER — Encounter (HOSPITAL_BASED_OUTPATIENT_CLINIC_OR_DEPARTMENT_OTHER): Payer: Self-pay

## 2011-10-24 DIAGNOSIS — Z91018 Allergy to other foods: Secondary | ICD-10-CM | POA: Insufficient documentation

## 2011-10-24 DIAGNOSIS — L03317 Cellulitis of buttock: Secondary | ICD-10-CM

## 2011-10-24 DIAGNOSIS — E079 Disorder of thyroid, unspecified: Secondary | ICD-10-CM | POA: Insufficient documentation

## 2011-10-24 DIAGNOSIS — M109 Gout, unspecified: Secondary | ICD-10-CM | POA: Insufficient documentation

## 2011-10-24 DIAGNOSIS — Z91013 Allergy to seafood: Secondary | ICD-10-CM | POA: Insufficient documentation

## 2011-10-24 DIAGNOSIS — M129 Arthropathy, unspecified: Secondary | ICD-10-CM | POA: Insufficient documentation

## 2011-10-24 DIAGNOSIS — IMO0001 Reserved for inherently not codable concepts without codable children: Secondary | ICD-10-CM | POA: Insufficient documentation

## 2011-10-24 DIAGNOSIS — Z8614 Personal history of Methicillin resistant Staphylococcus aureus infection: Secondary | ICD-10-CM | POA: Insufficient documentation

## 2011-10-24 DIAGNOSIS — L0231 Cutaneous abscess of buttock: Secondary | ICD-10-CM | POA: Insufficient documentation

## 2011-10-24 DIAGNOSIS — J330 Polyp of nasal cavity: Secondary | ICD-10-CM | POA: Insufficient documentation

## 2011-10-24 DIAGNOSIS — J339 Nasal polyp, unspecified: Secondary | ICD-10-CM

## 2011-10-24 HISTORY — DX: Methicillin resistant Staphylococcus aureus infection, unspecified site: A49.02

## 2011-10-24 MED ORDER — SULFAMETHOXAZOLE-TRIMETHOPRIM 800-160 MG PO TABS: 2.0000 | ORAL_TABLET | Freq: Two times a day (BID) | ORAL | Status: AC

## 2011-10-24 MED ORDER — CEPHALEXIN 500 MG PO CAPS: 500.0000 mg | ORAL_CAPSULE | Freq: Four times a day (QID) | ORAL | Status: AC

## 2011-10-24 NOTE — ED Notes (Signed)
MD at bedside. 

## 2011-10-24 NOTE — ED Notes (Signed)
C/o bump on the inside of right nostril x 6 days-also c/o "boil" to right buttock x today

## 2011-10-28 NOTE — ED Provider Notes (Signed)
History     CSN: [id number]  Arrival date & time 10/24/11  1906   First MD Initiated Contact with Patient 10/24/11 1957      Chief Complaint  Patient presents with  . Facial Pain  . Abscess    (Consider location/radiation/quality/duration/timing/severity/associated sxs/prior treatment) HPI Comments: Pt comes in with cc of bump to the buttock. She has hx of boils, and her current lesion started few days back. The lesion is painful. No drainage yet. She has no n/v/f/c. No diabetes. Pt also has a right nare lesion that is new, and painful, with no drainage. No hx of polyps. Breathing without any problems.  Patient is a 46 y.o. female presenting with abscess. The history is provided by the patient.  Abscess  Pertinent negatives include no vomiting.    Past Medical History  Diagnosis Date  . Thyroid disease   . Gout   . Arthritis   . Fibromyalgia   . MRSA (methicillin resistant Staphylococcus aureus)     Past Surgical History  Procedure Date  . Appendectomy   . Cholecystectomy   . Tonsillectomy     No family history on file.  History  Substance Use Topics  . Smoking status: Never Smoker   . Smokeless tobacco: Not on file  . Alcohol Use: No    OB History    Grav Para Term Preterm Abortions TAB SAB Ect Mult Living                  Review of Systems  Constitutional: Negative for activity change.  HENT: Negative for neck pain.   Respiratory: Negative for shortness of breath.   Cardiovascular: Negative for chest pain.  Gastrointestinal: Negative for nausea, vomiting and abdominal pain.  Genitourinary: Negative for dysuria.  Skin: Positive for rash.  Neurological: Negative for headaches.    Allergies  Almond oil; Apple; Avocado; Banana; Cabbage; Carrot; Eggs or egg-derived products; Milk-related compounds; Orange fruit; Peach; Shellfish allergy; Strawberry; Tomato; Yeast-related products; Ultracet; and Ultram  Home Medications   Current Outpatient Rx  Name  Route Sig Dispense Refill  . ALBUTEROL SULFATE HFA 108 (90 BASE) MCG/ACT IN AERS Inhalation Inhale 2 puffs into the lungs every 6 (six) hours as needed. For wheezing or shortness of breath     . CEPHALEXIN 500 MG PO CAPS Oral Take 1 capsule (500 mg total) by mouth 4 (four) times daily. 40 capsule 0  . CYCLOBENZAPRINE HCL 10 MG PO TABS Oral Take 10 mg by mouth 3 (three) times daily as needed. For muscle spasms      . DULOXETINE HCL 60 MG PO CPEP Oral Take 60 mg by mouth daily.      Marland Kitchen EPINEPHRINE 0.3 MG/0.3ML IJ DEVI Intramuscular Inject 0.3 mg into the muscle once.    [name] (65 FE) MG PO TABS Oral Take 325 mg by mouth daily.      Marland Kitchen LANSOPRAZOLE 30 MG PO CPDR Oral Take 60 mg by mouth daily.      Marland Kitchen LEVOTHYROXINE SODIUM 175 MCG PO TABS Oral Take 175 mcg by mouth daily.      Marland Kitchen METOCLOPRAMIDE HCL 10 MG PO TABS Oral Take 10 mg by mouth daily.      Marland Kitchen PHENTERMINE HCL 37.5 MG PO TABS Oral Take 37.5 mg by mouth daily before breakfast.    . PROBENECID 500 MG PO TABS Oral Take 500 mg by mouth 2 (two) times daily.      . SULFAMETHOXAZOLE-TRIMETHOPRIM 800-160 MG PO  TABS Oral Take 2 tablets by mouth 2 (two) times daily. 40 tablet 0    BP 136/81  Pulse 75  Temp 98.7 F (37.1 C) (Oral)  Resp 16  Ht 5\' 6"  (1.676 m)  Wt 240 lb (108.863 kg)  BMI 38.74 kg/m2  SpO2 98%  Physical Exam  Nursing note and vitals reviewed. Constitutional: She is oriented to person, place, and time. She appears well-developed and well-nourished.  HENT:  Head: Normocephalic and atraumatic.       Right nare has a small pustule anteriorly  Eyes: EOM are normal. Pupils are equal, round, and reactive to light.  Neck: Neck supple.  Cardiovascular: Normal rate, regular rhythm and normal heart sounds.   No murmur heard. Pulmonary/Chest: Effort normal. No respiratory distress.  Abdominal: Soft. She exhibits no distension. There is no tenderness. There is no rebound and no guarding.  Neurological: She is alert and  oriented to person, place, and time.  Skin: Skin is warm and dry. Rash noted.       Left gluteal area has a 3 cm diameter lesion that is erythematous, but has no fluctuance, and no pustule. There is slight induration.    ED Course  Procedures (including critical care time)  Labs Reviewed - No data to display No results found.   1. Cellulitis and abscess of buttock   2. Nasal polyp       MDM  Pt comes in with nose pain and abscess. The buttock has no clear evidence of abscess. Likely cellulitis, with may be early phlegmon. No ous noticed that can be expressed per exam. Will give AB and advocate warm compresses. The nare has a pustule - no acute concerns for any emergent condition, and she has no signs or symptoms of cavernous sinus thrombosis. Will give AB, pain meds and d./c.        Derwood Kaplan, MD 10/28/11 [phone]

## 2011-12-09 ENCOUNTER — Emergency Department (HOSPITAL_BASED_OUTPATIENT_CLINIC_OR_DEPARTMENT_OTHER)
Admission: EM | Admit: 2011-12-09 | Discharge: 2011-12-09 | Disposition: A | Discharge status: Home / Self Care | Payer: Medicaid Other | Attending: Emergency Medicine | Admitting: Emergency Medicine

## 2011-12-09 ENCOUNTER — Encounter (HOSPITAL_BASED_OUTPATIENT_CLINIC_OR_DEPARTMENT_OTHER): Payer: Self-pay | Admitting: *Deleted

## 2011-12-09 DIAGNOSIS — Z8739 Personal history of other diseases of the musculoskeletal system and connective tissue: Secondary | ICD-10-CM | POA: Insufficient documentation

## 2011-12-09 DIAGNOSIS — E039 Hypothyroidism, unspecified: Secondary | ICD-10-CM | POA: Insufficient documentation

## 2011-12-09 DIAGNOSIS — E119 Type 2 diabetes mellitus without complications: Secondary | ICD-10-CM | POA: Insufficient documentation

## 2011-12-09 DIAGNOSIS — Z8614 Personal history of Methicillin resistant Staphylococcus aureus infection: Secondary | ICD-10-CM | POA: Insufficient documentation

## 2011-12-09 DIAGNOSIS — Z79899 Other long term (current) drug therapy: Secondary | ICD-10-CM | POA: Insufficient documentation

## 2011-12-09 DIAGNOSIS — E7800 Pure hypercholesterolemia, unspecified: Secondary | ICD-10-CM | POA: Insufficient documentation

## 2011-12-09 DIAGNOSIS — R51 Headache: Secondary | ICD-10-CM | POA: Insufficient documentation

## 2011-12-09 HISTORY — DX: Type 2 diabetes mellitus without complications: E11.9

## 2011-12-09 LAB — BASIC METABOLIC PANEL
GFR calc non Af Amer: 66 mL/min — ABNORMAL LOW (ref >90)
Glucose, Bld: 91 mg/dL (ref 70–99)
Potassium: 3.8 mEq/L (ref 3.5–5.1)
Sodium: 141 mEq/L (ref 135–145)

## 2011-12-09 LAB — CBC WITH DIFFERENTIAL/PLATELET
Eosinophils Absolute: 0.1 10*3/uL (ref 0.0–0.7)
Lymphocytes Relative: 48 % — ABNORMAL HIGH (ref 12–46)
Lymphs Abs: 3.4 10*3/uL (ref 0.7–4.0)
Neutrophils Relative %: 46 % (ref 43–77)
Platelets: 248 10*3/uL (ref 150–400)
RBC: 3.48 MIL/uL — ABNORMAL LOW (ref 3.87–5.11)
WBC: 7.2 10*3/uL (ref 4.0–10.5)

## 2011-12-09 LAB — URINALYSIS, ROUTINE W REFLEX MICROSCOPIC
Hgb urine dipstick: NEGATIVE
Leukocytes, UA: NEGATIVE
Nitrite: NEGATIVE
Specific Gravity, Urine: 1.02 (ref 1.005–1.030)
Urobilinogen, UA: 1 mg/dL (ref 0.0–1.0)

## 2011-12-09 MED ORDER — ONDANSETRON HCL 4 MG/2ML IJ SOLN
4.0000 mg | Freq: Once | INTRAMUSCULAR | Status: AC
  Administered 2011-12-09: 4 mg via INTRAVENOUS
  Filled 2011-12-09: qty 2

## 2011-12-09 MED ORDER — KETOROLAC TROMETHAMINE 30 MG/ML IJ SOLN
30.0000 mg | Freq: Once | INTRAMUSCULAR | Status: AC
  Administered 2011-12-09: 30 mg via INTRAVENOUS
  Filled 2011-12-09: qty 1

## 2011-12-09 MED ORDER — SODIUM CHLORIDE 0.9 % IV BOLUS (SEPSIS)
1000.0000 mL | Freq: Once | INTRAVENOUS | Status: AC
  Administered 2011-12-09: 1000 mL via INTRAVENOUS

## 2011-12-09 NOTE — ED Notes (Signed)
I assisted patient to restroom, she gave urine sample which I took to lab for holding.

## 2011-12-09 NOTE — ED Notes (Signed)
D/c home with family to drive 

## 2011-12-09 NOTE — ED Provider Notes (Signed)
History   This chart was scribed for Kristine [name], MD by [name], ED Scribe. The patient was seen in room MH10/MH10 and the patient's care was started at 9:03PM.     CSN: [id number]  Arrival date & time 12/09/11  1904   First MD Initiated Contact with Patient 12/09/11 2103      Chief Complaint  Patient presents with  . Dizziness    (Consider location/radiation/quality/duration/timing/severity/associated sxs/prior treatment) The history is provided by the patient. No language interpreter was used.    Kristine Ramos is a 46 y.o. female , with a hx of tension headache and migraine, who presents to the Emergency Department complaining of progressively worsening, headache, located at the left side of the head and neck, onset yesterday (12/08/11).  Associated symptoms include dizziness. The pt reports she was standing in line at the shopping center yesterday, where she began to experience a sensation of dizziness. The pt characterizes her dizziness as a sensation of feeling as if she is going to pass out. The pt has a hx of headaches, however, this is not the most severe headache she has experienced. The pt has not taken any medications to relieve the headache.  The pt has a hx of spinal stenosis, thyroid disease, gout, arthritis, fibromyalgia, MRSA, diabetes mellitus, hypercholesteremia, appendectomy, cholecystectomy, and tonsillectomy.   The pt denies photophobia, nausea, and fever.  The pt does not smoke or drink alcohol.      Past Medical History  Diagnosis Date  . Thyroid disease   . Gout   . Arthritis   . Fibromyalgia   . MRSA (methicillin resistant Staphylococcus aureus)   . Diabetes mellitus without complication   . Hypercholesteremia     Past Surgical History  Procedure Date  . Appendectomy   . Cholecystectomy   . Tonsillectomy     History reviewed. No pertinent family history.  History  Substance Use Topics  . Smoking status: Never Smoker   .  Smokeless tobacco: Not on file  . Alcohol Use: No    OB History    Grav Para Term Preterm Abortions TAB SAB Ect Mult Living                  Review of Systems  All other systems reviewed and are negative.    Allergies  Almond oil; Apple; Avocado; Banana; Cabbage; Carrot; Eggs or egg-derived products; Milk-related compounds; Orange fruit; Peach; Shellfish allergy; Strawberry; Tomato; Yeast-related products; Ultracet; and Ultram  Home Medications   Current Outpatient Rx  Name  Route  Sig  Dispense  Refill  . OMEPRAZOLE 40 MG PO CPDR   Oral   Take 40 mg by mouth daily.         . ALBUTEROL SULFATE HFA 108 (90 BASE) MCG/ACT IN AERS   Inhalation   Inhale 2 puffs into the lungs every 6 (six) hours as needed. For wheezing or shortness of breath          . CEPHALEXIN 500 MG PO CAPS   Oral   Take 1 capsule (500 mg total) by mouth 4 (four) times daily.   40 capsule   0   . CYCLOBENZAPRINE HCL 10 MG PO TABS   Oral   Take 10 mg by mouth 3 (three) times daily as needed. For muscle spasms           . DULOXETINE HCL 60 MG PO CPEP   Oral   Take 60 mg by mouth daily.           Marland Kitchen  EPINEPHRINE 0.3 MG/0.3ML IJ DEVI   Intramuscular   Inject 0.3 mg into the muscle once.         [name] (65 FE) MG PO TABS   Oral   Take 325 mg by mouth daily.           Marland Kitchen LANSOPRAZOLE 30 MG PO CPDR   Oral   Take 60 mg by mouth daily.           Marland Kitchen LEVOTHYROXINE SODIUM 175 MCG PO TABS   Oral   Take 137 mcg by mouth daily.          Marland Kitchen METOCLOPRAMIDE HCL 10 MG PO TABS   Oral   Take 10 mg by mouth daily.           Marland Kitchen PHENTERMINE HCL 37.5 MG PO TABS   Oral   Take 37.5 mg by mouth daily before breakfast.         . PROBENECID 500 MG PO TABS   Oral   Take 500 mg by mouth 2 (two) times daily.           . SULFAMETHOXAZOLE-TRIMETHOPRIM 800-160 MG PO TABS   Oral   Take 2 tablets by mouth 2 (two) times daily.   40 tablet   0     BP 132/68  Pulse 54  Temp 98.9  F (37.2 C) (Oral)  Resp 20  Ht 5\' 6"  (1.676 m)  Wt 244 lb (110.678 kg)  BMI 39.38 kg/m2  SpO2 100%  Physical Exam  Nursing note and vitals reviewed. Constitutional: She is oriented to person, place, and time. She appears well-developed and well-nourished.  HENT:  Head: Normocephalic and atraumatic.  Eyes: EOM are normal. Pupils are equal, round, and reactive to light.  Neck: Normal range of motion. Neck supple.  Cardiovascular: Normal rate, normal heart sounds and intact distal pulses.   Pulmonary/Chest: Effort normal and breath sounds normal.  Abdominal: Bowel sounds are normal. She exhibits no distension. There is no tenderness.  Musculoskeletal: Normal range of motion. She exhibits no edema and no tenderness.  Neurological: She is alert and oriented to person, place, and time. She has normal strength. No cranial nerve deficit or sensory deficit.  Skin: Skin is warm and dry. No rash noted.  Psychiatric: She has a normal mood and affect.    ED Course  Procedures (including critical care time)  DIAGNOSTIC STUDIES: Oxygen Saturation is 100% on room air, normal by my interpretation.    COORDINATION OF CARE:  9:15 PM- Treatment plan discussed with patient. Pt agrees with treatment.      Results for orders placed during the hospital encounter of 12/09/11  CBC WITH DIFFERENTIAL      Component Value Range   WBC 7.2  4.0 - 10.5 K/uL   RBC 3.48 (*) 3.87 - 5.11 MIL/uL   Hemoglobin 9.6 (*) 12.0 - 15.0 g/dL   HCT 29.5 (*) 62.1 - 30.8 %   MCV 85.3  78.0 - 100.0 fL   MCH 27.6  26.0 - 34.0 pg   MCHC 32.3  30.0 - 36.0 g/dL   RDW 65.7  84.6 - 96.2 %   Platelets 248  150 - 400 K/uL   Neutrophils Relative 46  43 - 77 %   Neutro Abs 3.3  1.7 - 7.7 K/uL   Lymphocytes Relative 48 (*) 12 - 46 %   Lymphs Abs 3.4  0.7 - 4.0 K/uL   Monocytes Relative 6  3 -  12 %   Monocytes Absolute 0.4  0.1 - 1.0 K/uL   Eosinophils Relative 1  0 - 5 %   Eosinophils Absolute 0.1  0.0 - 0.7 K/uL    Basophils Relative 0  0 - 1 %   Basophils Absolute 0.0  0.0 - 0.1 K/uL  BASIC METABOLIC PANEL      Component Value Range   Sodium 141  135 - 145 mEq/L   Potassium 3.8  3.5 - 5.1 mEq/L   Chloride 103  96 - 112 mEq/L   CO2 26  19 - 32 mEq/L   Glucose, Bld 91  70 - 99 mg/dL   BUN 15  6 - 23 mg/dL   Creatinine, Ser 4.09  0.50 - 1.10 mg/dL   Calcium 9.7  8.4 - 81.1 mg/dL   GFR calc non Af Amer 66 (*) >90 mL/min   GFR calc Af Amer 77 (*) >90 mL/min  URINALYSIS, ROUTINE W REFLEX MICROSCOPIC      Component Value Range   Color, Urine YELLOW  YELLOW   APPearance CLEAR  CLEAR   Specific Gravity, Urine 1.020  1.005 - 1.030   pH 7.0  5.0 - 8.0   Glucose, UA NEGATIVE  NEGATIVE mg/dL   Hgb urine dipstick NEGATIVE  NEGATIVE   Bilirubin Urine NEGATIVE  NEGATIVE   Ketones, ur NEGATIVE  NEGATIVE mg/dL   Protein, ur NEGATIVE  NEGATIVE mg/dL   Urobilinogen, UA 1.0  0.0 - 1.0 mg/dL   Nitrite NEGATIVE  NEGATIVE   Leukocytes, UA NEGATIVE  NEGATIVE     No results found.   No diagnosis found.    MDM  Labs unremarkable. Pt feeling better, ready to go home.       I personally performed the services described in this documentation, which was scribed in my presence. The recorded information has been reviewed and is accurate.     Emeril Stille B. Bernette Mayers, MD 12/09/11 2249

## 2011-12-09 NOTE — ED Notes (Signed)
Pt states she was ? Bit by something the day before yest on her left forearm. Began to feel dizzy. Also c/o ear pain, H/A and lips feel numb. No neuro deficits noted.

## 2012-06-12 ENCOUNTER — Encounter (HOSPITAL_BASED_OUTPATIENT_CLINIC_OR_DEPARTMENT_OTHER): Payer: Self-pay

## 2012-06-12 ENCOUNTER — Emergency Department (HOSPITAL_BASED_OUTPATIENT_CLINIC_OR_DEPARTMENT_OTHER): Payer: Self-pay

## 2012-06-12 ENCOUNTER — Emergency Department (HOSPITAL_BASED_OUTPATIENT_CLINIC_OR_DEPARTMENT_OTHER)
Admission: EM | Admit: 2012-06-12 | Discharge: 2012-06-12 | Disposition: A | Discharge status: Home / Self Care | Payer: Self-pay | Attending: Emergency Medicine | Admitting: Emergency Medicine

## 2012-06-12 DIAGNOSIS — R059 Cough, unspecified: Secondary | ICD-10-CM | POA: Insufficient documentation

## 2012-06-12 DIAGNOSIS — Z8614 Personal history of Methicillin resistant Staphylococcus aureus infection: Secondary | ICD-10-CM | POA: Insufficient documentation

## 2012-06-12 DIAGNOSIS — Z87891 Personal history of nicotine dependence: Secondary | ICD-10-CM | POA: Insufficient documentation

## 2012-06-12 DIAGNOSIS — R0609 Other forms of dyspnea: Secondary | ICD-10-CM | POA: Insufficient documentation

## 2012-06-12 DIAGNOSIS — E119 Type 2 diabetes mellitus without complications: Secondary | ICD-10-CM | POA: Insufficient documentation

## 2012-06-12 DIAGNOSIS — R05 Cough: Secondary | ICD-10-CM | POA: Insufficient documentation

## 2012-06-12 DIAGNOSIS — Z79899 Other long term (current) drug therapy: Secondary | ICD-10-CM | POA: Insufficient documentation

## 2012-06-12 DIAGNOSIS — R631 Polydipsia: Secondary | ICD-10-CM | POA: Insufficient documentation

## 2012-06-12 DIAGNOSIS — J029 Acute pharyngitis, unspecified: Secondary | ICD-10-CM | POA: Insufficient documentation

## 2012-06-12 DIAGNOSIS — R0989 Other specified symptoms and signs involving the circulatory and respiratory systems: Secondary | ICD-10-CM | POA: Insufficient documentation

## 2012-06-12 DIAGNOSIS — R51 Headache: Secondary | ICD-10-CM | POA: Insufficient documentation

## 2012-06-12 DIAGNOSIS — Z9089 Acquired absence of other organs: Secondary | ICD-10-CM | POA: Insufficient documentation

## 2012-06-12 DIAGNOSIS — R079 Chest pain, unspecified: Secondary | ICD-10-CM | POA: Insufficient documentation

## 2012-06-12 DIAGNOSIS — IMO0001 Reserved for inherently not codable concepts without codable children: Secondary | ICD-10-CM | POA: Insufficient documentation

## 2012-06-12 DIAGNOSIS — Z8639 Personal history of other endocrine, nutritional and metabolic disease: Secondary | ICD-10-CM | POA: Insufficient documentation

## 2012-06-12 DIAGNOSIS — R509 Fever, unspecified: Secondary | ICD-10-CM | POA: Insufficient documentation

## 2012-06-12 DIAGNOSIS — E039 Hypothyroidism, unspecified: Secondary | ICD-10-CM | POA: Insufficient documentation

## 2012-06-12 DIAGNOSIS — Z862 Personal history of diseases of the blood and blood-forming organs and certain disorders involving the immune mechanism: Secondary | ICD-10-CM | POA: Insufficient documentation

## 2012-06-12 DIAGNOSIS — M129 Arthropathy, unspecified: Secondary | ICD-10-CM | POA: Insufficient documentation

## 2012-06-12 LAB — GLUCOSE, CAPILLARY: Glucose-Capillary: 90 mg/dL (ref 70–99)

## 2012-06-12 MED ORDER — PREDNISONE 10 MG PO TABS
60.0000 mg | ORAL_TABLET | Freq: Once | ORAL | Status: AC
  Administered 2012-06-12: 60 mg via ORAL
  Filled 2012-06-12: qty 1

## 2012-06-12 MED ORDER — IPRATROPIUM BROMIDE 0.02 % IN SOLN
0.5000 mg | Freq: Once | RESPIRATORY_TRACT | Status: AC
  Administered 2012-06-12: 0.5 mg via RESPIRATORY_TRACT
  Filled 2012-06-12: qty 2.5

## 2012-06-12 MED ORDER — PREDNISONE 10 MG PO TABS: 20.0000 mg | ORAL_TABLET | Freq: Every day | ORAL | Status: AC

## 2012-06-12 MED ORDER — ALBUTEROL SULFATE (5 MG/ML) 0.5% IN NEBU
5.0000 mg | INHALATION_SOLUTION | Freq: Once | RESPIRATORY_TRACT | Status: AC
  Administered 2012-06-12: 5 mg via RESPIRATORY_TRACT
  Filled 2012-06-12: qty 1

## 2012-06-12 NOTE — ED Notes (Signed)
Patient preparing for discharge. 

## 2012-06-12 NOTE — ED Notes (Signed)
Cough x 3 weeks-also c/o pain to left foot x 3 months-denies injury-steady gait to tx area with consistent nonprod cough

## 2012-06-12 NOTE — ED Provider Notes (Signed)
History     CSN: [id number]  Arrival date & time 06/12/12  1142   First MD Initiated Contact with Patient 06/12/12 1158      Chief Complaint  Patient presents with  . Cough    (Consider location/radiation/quality/duration/timing/severity/associated sxs/prior treatment) HPI Patient with cough for three weeks.  Began with subjective fever, sore throat, chest hurst with cough and headache.  Continues np cough.  Patient uses inhaler without relief.  Some dyspnea increased thirst and feels dry.  No current pmd.  Past Medical History  Diagnosis Date  . Thyroid disease   . Gout   . Arthritis   . Fibromyalgia   . MRSA (methicillin resistant Staphylococcus aureus)   . Diabetes mellitus without complication   . Hypercholesteremia     Past Surgical History  Procedure Laterality Date  . Appendectomy    . Cholecystectomy    . Tonsillectomy      No family history on file.  History  Substance Use Topics  . Smoking status: Former Games developer  . Smokeless tobacco: Not on file  . Alcohol Use: No    OB History   Grav Para Term Preterm Abortions TAB SAB Ect Mult Living                  Review of Systems  All other systems reviewed and are negative.    Allergies  Almond oil; Apple; Avocado; Banana; Cabbage; Carrot; Eggs or egg-derived products; Milk-related compounds; Orange fruit; Peach; Shellfish allergy; Strawberry; Tomato; Yeast-related products; Ultracet; and Ultram  Home Medications   Current Outpatient Rx  Name  Route  Sig  Dispense  Refill  . albuterol (PROVENTIL HFA;VENTOLIN HFA) 108 (90 BASE) MCG/ACT inhaler   Inhalation   Inhale 2 puffs into the lungs every 6 (six) hours as needed. For wheezing or shortness of breath          . cephALEXin (KEFLEX) 500 MG capsule   Oral   Take 1 capsule (500 mg total) by mouth 4 (four) times daily.   40 capsule   0   . cyclobenzaprine (FLEXERIL) 10 MG tablet   Oral   Take 10 mg by mouth 3 (three) times daily as needed. For  muscle spasms           . DULoxetine (CYMBALTA) 60 MG capsule   Oral   Take 60 mg by mouth daily.           Marland Kitchen EPINEPHrine (EPIPEN) 0.3 mg/0.3 mL DEVI   Intramuscular   Inject 0.3 mg into the muscle once.         . ferrous sulfate 325 (65 FE) MG tablet   Oral   Take 325 mg by mouth daily.           . lansoprazole (PREVACID) 30 MG capsule   Oral   Take 60 mg by mouth daily.           Marland Kitchen levothyroxine (SYNTHROID, LEVOTHROID) 175 MCG tablet   Oral   Take 137 mcg by mouth daily.          . metoCLOPramide (REGLAN) 10 MG tablet   Oral   Take 10 mg by mouth daily.           Marland Kitchen omeprazole (PRILOSEC) 40 MG capsule   Oral   Take 40 mg by mouth daily.         . phentermine (ADIPEX-P) 37.5 MG tablet   Oral   Take 37.5 mg by mouth daily before breakfast.         .  probenecid (BENEMID) 500 MG tablet   Oral   Take 500 mg by mouth 2 (two) times daily.           Marland Kitchen sulfamethoxazole-trimethoprim (SEPTRA DS) 800-160 MG per tablet   Oral   Take 2 tablets by mouth 2 (two) times daily.   40 tablet   0     BP 124/68  Pulse 82  Temp(Src) 98.1 F (36.7 C) (Oral)  Resp 20  SpO2 99%  Physical Exam  Nursing note and vitals reviewed. Constitutional: She is oriented to person, place, and time. She appears well-developed and well-nourished.  HENT:  Head: Normocephalic and atraumatic.  Right Ear: External ear normal.  Left Ear: External ear normal.  Nose: Nose normal.  Mouth/Throat: Oropharynx is clear and moist.  Eyes: Conjunctivae and EOM are normal. Pupils are equal, round, and reactive to light.  Neck: Normal range of motion. Neck supple.  Cardiovascular: Normal rate, regular rhythm, normal heart sounds and intact distal pulses.   Pulmonary/Chest: Effort normal and breath sounds normal.  couging  Abdominal: Soft. Bowel sounds are normal.  Musculoskeletal: Normal range of motion.  Left heel ttp  Neurological: She is alert and oriented to person, place, and  time. She has normal reflexes.  Skin: Skin is warm and dry.  Psychiatric: She has a normal mood and affect. Her behavior is normal. Thought content normal.    ED Course  Procedures (including critical care time)  Labs Reviewed - No data to display No results found.   No diagnosis found.    MDM  Symptoms better after neb and prednisone.  BS pending.  Plan continue inhaler and add prednisone.        Hilario Quarry, MD 06/12/12 [phone]

## 2012-07-16 ENCOUNTER — Emergency Department (HOSPITAL_BASED_OUTPATIENT_CLINIC_OR_DEPARTMENT_OTHER): Payer: Medicaid Other

## 2012-07-16 ENCOUNTER — Encounter (HOSPITAL_BASED_OUTPATIENT_CLINIC_OR_DEPARTMENT_OTHER): Payer: Self-pay

## 2012-07-16 ENCOUNTER — Emergency Department (HOSPITAL_BASED_OUTPATIENT_CLINIC_OR_DEPARTMENT_OTHER)
Admission: EM | Admit: 2012-07-16 | Discharge: 2012-07-16 | Disposition: A | Discharge status: Home / Self Care | Payer: Medicaid Other | Attending: Emergency Medicine | Admitting: Emergency Medicine

## 2012-07-16 DIAGNOSIS — S161XXA Strain of muscle, fascia and tendon at neck level, initial encounter: Secondary | ICD-10-CM

## 2012-07-16 DIAGNOSIS — Z79899 Other long term (current) drug therapy: Secondary | ICD-10-CM | POA: Insufficient documentation

## 2012-07-16 DIAGNOSIS — Y9289 Other specified places as the place of occurrence of the external cause: Secondary | ICD-10-CM | POA: Insufficient documentation

## 2012-07-16 DIAGNOSIS — E119 Type 2 diabetes mellitus without complications: Secondary | ICD-10-CM | POA: Insufficient documentation

## 2012-07-16 DIAGNOSIS — M549 Dorsalgia, unspecified: Secondary | ICD-10-CM

## 2012-07-16 DIAGNOSIS — M109 Gout, unspecified: Secondary | ICD-10-CM | POA: Insufficient documentation

## 2012-07-16 DIAGNOSIS — Y9389 Activity, other specified: Secondary | ICD-10-CM | POA: Insufficient documentation

## 2012-07-16 DIAGNOSIS — S139XXA Sprain of joints and ligaments of unspecified parts of neck, initial encounter: Secondary | ICD-10-CM | POA: Insufficient documentation

## 2012-07-16 DIAGNOSIS — E079 Disorder of thyroid, unspecified: Secondary | ICD-10-CM | POA: Insufficient documentation

## 2012-07-16 DIAGNOSIS — Z8614 Personal history of Methicillin resistant Staphylococcus aureus infection: Secondary | ICD-10-CM | POA: Insufficient documentation

## 2012-07-16 DIAGNOSIS — Z87891 Personal history of nicotine dependence: Secondary | ICD-10-CM | POA: Insufficient documentation

## 2012-07-16 DIAGNOSIS — W010XXA Fall on same level from slipping, tripping and stumbling without subsequent striking against object, initial encounter: Secondary | ICD-10-CM | POA: Insufficient documentation

## 2012-07-16 DIAGNOSIS — Z8739 Personal history of other diseases of the musculoskeletal system and connective tissue: Secondary | ICD-10-CM | POA: Insufficient documentation

## 2012-07-16 DIAGNOSIS — IMO0002 Reserved for concepts with insufficient information to code with codable children: Secondary | ICD-10-CM | POA: Insufficient documentation

## 2012-07-16 MED ORDER — METHOCARBAMOL 500 MG PO TABS: 500.0000 mg | ORAL_TABLET | Freq: Two times a day (BID) | ORAL | Status: AC

## 2012-07-16 MED ORDER — IBUPROFEN 800 MG PO TABS: 800.0000 mg | ORAL_TABLET | Freq: Three times a day (TID) | ORAL | Status: AC

## 2012-07-16 NOTE — ED Notes (Signed)
Tripped over boxes in a store last night-pain to neck, lower back radiating down leg and to right knee

## 2012-07-16 NOTE — ED Provider Notes (Signed)
History     CSN: [id number]  Arrival date & time 07/16/12  1315   First MD Initiated Contact with Patient 07/16/12 1404      Chief Complaint  Patient presents with  . Fall    (Consider location/radiation/quality/duration/timing/severity/associated sxs/prior treatment) Patient is a 47 y.o. female presenting with fall. The history is provided by the patient. No language interpreter was used.  Fall This is a new problem. The current episode started yesterday. The problem has been unchanged. Associated symptoms include myalgias. Pertinent negatives include no joint swelling. Nothing aggravates the symptoms. She has tried nothing for the symptoms. The treatment provided no relief.  Pt reports she was in a store and tripped over a box in the floor.  Pt reports she hit shelves.   No impact of head.   Pt complains of soreness in her left knee, low back to left buttock and down leg.   Pt also complains of pain in her neck.  No impact of neck.   Pt reports twisting and jerking only.   Past Medical History  Diagnosis Date  . Thyroid disease   . Gout   . Arthritis   . Fibromyalgia   . MRSA (methicillin resistant Staphylococcus aureus)   . Diabetes mellitus without complication     Past Surgical History  Procedure Laterality Date  . Appendectomy    . Cholecystectomy    . Tonsillectomy      No family history on file.  History  Substance Use Topics  . Smoking status: Former Games developer  . Smokeless tobacco: Not on file  . Alcohol Use: No    OB History   Grav Para Term Preterm Abortions TAB SAB Ect Mult Living                  Review of Systems  Musculoskeletal: Positive for myalgias and back pain. Negative for joint swelling.  All other systems reviewed and are negative.    Allergies  Almond oil; Apple; Avocado; Banana; Cabbage; Carrot; Eggs or egg-derived products; Milk-related compounds; Orange fruit; Peach; Shellfish allergy; Strawberry; Tomato; Yeast-related products;  Ultracet; and Ultram  Home Medications   Current Outpatient Rx  Name  Route  Sig  Dispense  Refill  . albuterol (PROVENTIL HFA;VENTOLIN HFA) 108 (90 BASE) MCG/ACT inhaler   Inhalation   Inhale 2 puffs into the lungs every 6 (six) hours as needed. For wheezing or shortness of breath          . cephALEXin (KEFLEX) 500 MG capsule   Oral   Take 1 capsule (500 mg total) by mouth 4 (four) times daily.   40 capsule   0   . cyclobenzaprine (FLEXERIL) 10 MG tablet   Oral   Take 10 mg by mouth 3 (three) times daily as needed. For muscle spasms           . DULoxetine (CYMBALTA) 60 MG capsule   Oral   Take 60 mg by mouth daily.           Marland Kitchen EPINEPHrine (EPIPEN) 0.3 mg/0.3 mL DEVI   Intramuscular   Inject 0.3 mg into the muscle once.         . ferrous sulfate 325 (65 FE) MG tablet   Oral   Take 325 mg by mouth daily.           . lansoprazole (PREVACID) 30 MG capsule   Oral   Take 60 mg by mouth daily.           Marland Kitchen  levothyroxine (SYNTHROID, LEVOTHROID) 175 MCG tablet   Oral   Take 137 mcg by mouth daily.          . metoCLOPramide (REGLAN) 10 MG tablet   Oral   Take 10 mg by mouth daily.           Marland Kitchen omeprazole (PRILOSEC) 40 MG capsule   Oral   Take 40 mg by mouth daily.         . phentermine (ADIPEX-P) 37.5 MG tablet   Oral   Take 37.5 mg by mouth daily before breakfast.         . predniSONE (DELTASONE) 10 MG tablet   Oral   Take 2 tablets (20 mg total) by mouth daily.   15 tablet   0   . probenecid (BENEMID) 500 MG tablet   Oral   Take 500 mg by mouth 2 (two) times daily.           Marland Kitchen sulfamethoxazole-trimethoprim (SEPTRA DS) 800-160 MG per tablet   Oral   Take 2 tablets by mouth 2 (two) times daily.   40 tablet   0     BP 140/71  Pulse 78  Temp(Src) 98.9 F (37.2 C) (Oral)  Resp 16  Ht 5\' 6"  (1.676 m)  Wt 245 lb (111.131 kg)  BMI 39.56 kg/m2  SpO2 100%  Physical Exam  Nursing note and vitals reviewed. Constitutional: She appears  well-developed and well-nourished.  HENT:  Head: Normocephalic.  Right Ear: External ear normal.  Left Ear: External ear normal.  Nose: Nose normal.  Mouth/Throat: Oropharynx is clear and moist.  Eyes: Conjunctivae are normal. Pupils are equal, round, and reactive to light.  Neck: Normal range of motion. Neck supple.  Cardiovascular: Normal rate and normal heart sounds.   Pulmonary/Chest: Effort normal and breath sounds normal.  Abdominal: Soft. There is no tenderness. There is no rebound.  Musculoskeletal: She exhibits tenderness.  Tender diffuse neck,  Tender diffuse ls spine,  Into left buttock and down to knee.    Diffusely tender left knee,  No effusion  Neurological: She is alert.  Skin: Skin is warm.    ED Course  Procedures (including critical care time)  Labs Reviewed - No data to display No results found.   No diagnosis found.    MDM   Results for orders placed during the hospital encounter of 06/12/12  GLUCOSE, CAPILLARY      Result Value Range   Glucose-Capillary 90  70 - 99 mg/dL   Comment 1 Documented in Chart     Comment 2 Notify RN     Dg Cervical Spine Complete  07/16/2012   *RADIOLOGY REPORT*  Clinical Data: Left neck pain following a fall last night.  CERVICAL SPINE - COMPLETE 4+ VIEW  Comparison: 09/24/2011.  Findings: Mild reversal of the normal cervical lordosis with some improvement.  Stable anterior spur formation at the C3-4 through C6- 7 levels.  No prevertebral soft tissue swelling, fractures or subluxations.  IMPRESSION:  1.  No fracture or subluxation. 2.  Stable degenerative changes. 3.  Mild reversal of the normal cervical lordosis with improvement. If   Original Report Authenticated By: Beckie Salts, M.D.   Dg Knee Complete 4 Views Left  07/16/2012   *RADIOLOGY REPORT*  Clinical Data: Left knee pain following a fall last night.  LEFT KNEE - COMPLETE 4+ VIEW  Comparison: None.  Findings: Minimal medial and lateral spur formation.  No fracture,  dislocation or effusion.  IMPRESSION:  1.  No fracture. 2.  Minimal degenerative changes.   Original Report Authenticated By: Beckie Salts, M.D.   Probable muscle strain neck and back,  Pt advised follow up with Orthopaedist if pain persist past one week.   Pt given rx for ibuprofen and robaxin for discomfort        Elson Areas, PA-C 07/16/12 1542

## 2012-07-17 NOTE — ED Provider Notes (Signed)
Medical screening examination/treatment/procedure(s) were performed by non-physician practitioner and as supervising physician I was immediately available for consultation/collaboration.   Gwyneth Sprout, MD 07/17/12 [phone]

## 2012-09-22 ENCOUNTER — Encounter (HOSPITAL_BASED_OUTPATIENT_CLINIC_OR_DEPARTMENT_OTHER): Payer: Self-pay | Admitting: *Deleted

## 2012-09-22 ENCOUNTER — Emergency Department (HOSPITAL_BASED_OUTPATIENT_CLINIC_OR_DEPARTMENT_OTHER)
Admission: EM | Admit: 2012-09-22 | Discharge: 2012-09-22 | Disposition: A | Discharge status: Home / Self Care | Payer: Self-pay | Attending: Emergency Medicine | Admitting: Emergency Medicine

## 2012-09-22 DIAGNOSIS — Z87891 Personal history of nicotine dependence: Secondary | ICD-10-CM | POA: Insufficient documentation

## 2012-09-22 DIAGNOSIS — E079 Disorder of thyroid, unspecified: Secondary | ICD-10-CM | POA: Insufficient documentation

## 2012-09-22 DIAGNOSIS — Z79899 Other long term (current) drug therapy: Secondary | ICD-10-CM | POA: Insufficient documentation

## 2012-09-22 DIAGNOSIS — R300 Dysuria: Secondary | ICD-10-CM | POA: Insufficient documentation

## 2012-09-22 DIAGNOSIS — R509 Fever, unspecified: Secondary | ICD-10-CM | POA: Insufficient documentation

## 2012-09-22 DIAGNOSIS — R110 Nausea: Secondary | ICD-10-CM | POA: Insufficient documentation

## 2012-09-22 DIAGNOSIS — Z3202 Encounter for pregnancy test, result negative: Secondary | ICD-10-CM | POA: Insufficient documentation

## 2012-09-22 DIAGNOSIS — Z9089 Acquired absence of other organs: Secondary | ICD-10-CM | POA: Insufficient documentation

## 2012-09-22 DIAGNOSIS — Z862 Personal history of diseases of the blood and blood-forming organs and certain disorders involving the immune mechanism: Secondary | ICD-10-CM | POA: Insufficient documentation

## 2012-09-22 DIAGNOSIS — IMO0002 Reserved for concepts with insufficient information to code with codable children: Secondary | ICD-10-CM | POA: Insufficient documentation

## 2012-09-22 DIAGNOSIS — Z8614 Personal history of Methicillin resistant Staphylococcus aureus infection: Secondary | ICD-10-CM | POA: Insufficient documentation

## 2012-09-22 DIAGNOSIS — N12 Tubulo-interstitial nephritis, not specified as acute or chronic: Secondary | ICD-10-CM | POA: Insufficient documentation

## 2012-09-22 DIAGNOSIS — Z8639 Personal history of other endocrine, nutritional and metabolic disease: Secondary | ICD-10-CM | POA: Insufficient documentation

## 2012-09-22 DIAGNOSIS — E119 Type 2 diabetes mellitus without complications: Secondary | ICD-10-CM | POA: Insufficient documentation

## 2012-09-22 DIAGNOSIS — R109 Unspecified abdominal pain: Secondary | ICD-10-CM | POA: Insufficient documentation

## 2012-09-22 DIAGNOSIS — IMO0001 Reserved for inherently not codable concepts without codable children: Secondary | ICD-10-CM | POA: Insufficient documentation

## 2012-09-22 DIAGNOSIS — R350 Frequency of micturition: Secondary | ICD-10-CM | POA: Insufficient documentation

## 2012-09-22 LAB — URINALYSIS, ROUTINE W REFLEX MICROSCOPIC
Bilirubin Urine: NEGATIVE
Glucose, UA: NEGATIVE mg/dL
Ketones, ur: NEGATIVE mg/dL
Nitrite: POSITIVE — AB
Protein, ur: 100 mg/dL — AB
Specific Gravity, Urine: 1.013 (ref 1.005–1.030)
Urobilinogen, UA: 0.2 mg/dL (ref 0.0–1.0)
pH: 6 (ref 5.0–8.0)

## 2012-09-22 LAB — URINE MICROSCOPIC-ADD ON

## 2012-09-22 LAB — PREGNANCY, URINE: Preg Test, Ur: NEGATIVE

## 2012-09-22 MED ORDER — ONDANSETRON HCL 4 MG/2ML IJ SOLN
4.0000 mg | Freq: Once | INTRAMUSCULAR | Status: DC
  Filled 2012-09-22: qty 2

## 2012-09-22 MED ORDER — HYDROCODONE-ACETAMINOPHEN 7.5-325 MG/15ML PO SOLN: 15.0000 mL | Freq: Four times a day (QID) | ORAL | Status: AC | PRN

## 2012-09-22 MED ORDER — CIPROFLOXACIN HCL 500 MG PO TABS
500.0000 mg | ORAL_TABLET | Freq: Once | ORAL | Status: AC
  Administered 2012-09-22: 500 mg via ORAL
  Filled 2012-09-22: qty 1

## 2012-09-22 MED ORDER — CIPROFLOXACIN HCL 500 MG PO TABS: 500.0000 mg | ORAL_TABLET | Freq: Two times a day (BID) | ORAL | Status: AC

## 2012-09-22 MED ORDER — ONDANSETRON 4 MG PO TBDP
4.0000 mg | ORAL_TABLET | Freq: Once | ORAL | Status: AC
  Administered 2012-09-22: 4 mg via ORAL
  Filled 2012-09-22: qty 1

## 2012-09-22 MED ORDER — MORPHINE SULFATE 4 MG/ML IJ SOLN: 4.0000 mg | Freq: Once | INTRAMUSCULAR | Status: DC

## 2012-09-22 MED ORDER — MORPHINE SULFATE 4 MG/ML IJ SOLN
4.0000 mg | Freq: Once | INTRAMUSCULAR | Status: DC
  Filled 2012-09-22: qty 1

## 2012-09-22 MED ORDER — CIPROFLOXACIN IN D5W 400 MG/200ML IV SOLN
400.0000 mg | Freq: Once | INTRAVENOUS | Status: DC
  Filled 2012-09-22: qty 200

## 2012-09-22 MED ORDER — PROMETHAZINE HCL 25 MG PO TABS: 25.0000 mg | ORAL_TABLET | Freq: Four times a day (QID) | ORAL | Status: AC | PRN

## 2012-09-22 MED ORDER — PROMETHAZINE HCL 25 MG/ML IJ SOLN
25.0000 mg | Freq: Once | INTRAMUSCULAR | Status: AC
  Administered 2012-09-22: 25 mg via INTRAMUSCULAR
  Filled 2012-09-22: qty 1

## 2012-09-22 MED ORDER — SODIUM CHLORIDE 0.9 % IV BOLUS (SEPSIS): 1000.0000 mL | Freq: Once | INTRAVENOUS | Status: DC

## 2012-09-22 MED ORDER — MORPHINE SULFATE 4 MG/ML IJ SOLN
4.0000 mg | Freq: Once | INTRAMUSCULAR | Status: AC
  Administered 2012-09-22: 4 mg via INTRAMUSCULAR

## 2012-09-22 NOTE — ED Notes (Addendum)
Patient with c/o low back pain since Friday.  Patient also is having polyuria and pain upon urination & chills

## 2012-09-22 NOTE — ED Provider Notes (Signed)
CSN: [id number]     Arrival date & time 09/22/12  1651 History     First MD Initiated Contact with Patient 09/22/12 1726     Chief Complaint  Patient presents with  . Back Pain   (Consider location/radiation/quality/duration/timing/severity/associated sxs/prior Treatment) HPI Comments: Patient is a 47 year old female with a past medical history of diabetes and arthritis who presents with a 3 day history of left flank and abdominal pain. The pain is located in her left flank and lower abdomen and does not radiate. The pain is described as aching and severe. The pain started gradually and progressively worsened since the onset. No alleviating/aggravating factors. The patient has tried nothing for symptoms without relief. Associated symptoms include dysuria, urinary frequency, fever, nausea. Patient denies headache, vomiting, diarrhea, chest pain, SOB, constipation, abnormal vaginal bleeding/discharge.     Patient is a 47 y.o. female presenting with back pain.  Back Pain Associated symptoms: dysuria and fever     Past Medical History  Diagnosis Date  . Thyroid disease   . Gout   . Arthritis   . Fibromyalgia   . MRSA (methicillin resistant Staphylococcus aureus)   . Diabetes mellitus without complication    Past Surgical History  Procedure Laterality Date  . Appendectomy    . Cholecystectomy    . Tonsillectomy     No family history on file. History  Substance Use Topics  . Smoking status: Former Games developer  . Smokeless tobacco: Not on file  . Alcohol Use: No   OB History   Grav Para Term Preterm Abortions TAB SAB Ect Mult Living                 Review of Systems  Constitutional: Positive for fever.  Gastrointestinal: Positive for nausea.  Genitourinary: Positive for dysuria, frequency and flank pain.  Musculoskeletal: Positive for back pain.  All other systems reviewed and are negative.    Allergies  Almond oil; Apple; Avocado; Banana; Cabbage; Carrot; Eggs or  egg-derived products; Milk-related compounds; Orange fruit; Peach; Shellfish allergy; Strawberry; Tomato; Yeast-related products; Ultracet; and Ultram  Home Medications   Current Outpatient Rx  Name  Route  Sig  Dispense  Refill  . gabapentin (NEURONTIN) 300 MG capsule   Oral   Take 300 mg by mouth 1 day or 1 dose.         . albuterol (PROVENTIL HFA;VENTOLIN HFA) 108 (90 BASE) MCG/ACT inhaler   Inhalation   Inhale 2 puffs into the lungs every 6 (six) hours as needed. For wheezing or shortness of breath          . cephALEXin (KEFLEX) 500 MG capsule   Oral   Take 1 capsule (500 mg total) by mouth 4 (four) times daily.   40 capsule   0   . cyclobenzaprine (FLEXERIL) 10 MG tablet   Oral   Take 10 mg by mouth 3 (three) times daily as needed. For muscle spasms           . DULoxetine (CYMBALTA) 60 MG capsule   Oral   Take 60 mg by mouth daily.           Marland Kitchen EPINEPHrine (EPIPEN) 0.3 mg/0.3 mL DEVI   Intramuscular   Inject 0.3 mg into the muscle once.         . ferrous sulfate 325 (65 FE) MG tablet   Oral   Take 325 mg by mouth daily.           Marland Kitchen  ibuprofen (ADVIL,MOTRIN) 800 MG tablet   Oral   Take 1 tablet (800 mg total) by mouth 3 (three) times daily.   21 tablet   0   . lansoprazole (PREVACID) 30 MG capsule   Oral   Take 60 mg by mouth daily.           Marland Kitchen levothyroxine (SYNTHROID, LEVOTHROID) 175 MCG tablet   Oral   Take 137 mcg by mouth daily.          . methocarbamol (ROBAXIN) 500 MG tablet   Oral   Take 1 tablet (500 mg total) by mouth 2 (two) times daily.   20 tablet   0   . metoCLOPramide (REGLAN) 10 MG tablet   Oral   Take 10 mg by mouth daily.           Marland Kitchen omeprazole (PRILOSEC) 40 MG capsule   Oral   Take 40 mg by mouth daily.         . phentermine (ADIPEX-P) 37.5 MG tablet   Oral   Take 37.5 mg by mouth daily before breakfast.         . predniSONE (DELTASONE) 10 MG tablet   Oral   Take 2 tablets (20 mg total) by mouth daily.    15 tablet   0   . probenecid (BENEMID) 500 MG tablet   Oral   Take 500 mg by mouth 2 (two) times daily.           Marland Kitchen sulfamethoxazole-trimethoprim (SEPTRA DS) 800-160 MG per tablet   Oral   Take 2 tablets by mouth 2 (two) times daily.   40 tablet   0    BP 159/70  Pulse 86  Temp(Src) 99.3 F (37.4 C) (Oral)  Resp 20  Ht 5\' 6"  (1.676 m)  Wt 239 lb (108.41 kg)  BMI 38.59 kg/m2  SpO2 100% Physical Exam  Nursing note and vitals reviewed. Constitutional: She is oriented to person, place, and time. She appears well-developed and well-nourished. No distress.  HENT:  Head: Normocephalic and atraumatic.  Eyes: Conjunctivae are normal.  Neck: Normal range of motion.  Cardiovascular: Normal rate and regular rhythm.  Exam reveals no gallop and no friction rub.   No murmur heard. Pulmonary/Chest: Effort normal and breath sounds normal. She has no wheezes. She has no rales. She exhibits no tenderness.  Abdominal: Soft. She exhibits no distension. There is tenderness. There is no rebound and no guarding.  Suprapubic tenderness to palpation. No peritoneal signs.   Genitourinary:  Left CVA tenderness.   Musculoskeletal: Normal range of motion.  Neurological: She is alert and oriented to person, place, and time. Coordination normal.  Speech is goal-oriented. Moves limbs without ataxia.   Skin: Skin is warm and dry.  Psychiatric: She has a normal mood and affect. Her behavior is normal.    ED Course   Procedures (including critical care time)  Labs Reviewed  URINALYSIS, ROUTINE W REFLEX MICROSCOPIC - Abnormal; Notable for the following:    APPearance TURBID (*)    Hgb urine dipstick MODERATE (*)    Protein, ur 100 (*)    Nitrite POSITIVE (*)    Leukocytes, UA LARGE (*)    All other components within normal limits  URINE MICROSCOPIC-ADD ON - Abnormal; Notable for the following:    Bacteria, UA FEW (*)    All other components within normal limits  URINE CULTURE  PREGNANCY,  URINE   No results found. 1. Pyelonephritis  MDM  5:31 PM Urinalysis shows UTI. Pregnancy test negative. Patient likely has pyelonephritis. Patient will have PO cipro morphine and zofran. Vitals stable and patient afebrile.   8:55 PM Patient feeling better. I will discharge the patient with Cipro, Vicodin, and Phenergan. Vitals stable and patient afebrile. Patient instructed to return with worsening or concerning symptoms.   Emilia Beck, [place] 09/22/12 2057

## 2012-09-22 NOTE — ED Notes (Signed)
Pt vomited clear liquid 15 minutes after taking Cipro.  No pill or pill fragments found. PA notified.  Plan is to watch pt at this time.

## 2012-09-25 LAB — URINE CULTURE: Colony Count: >=100000

## 2012-09-26 NOTE — ED Notes (Signed)
+   Urine Patient treated with Cipro-sensitive to same-chart appended per protocol MD. 

## 2012-09-26 NOTE — ED Provider Notes (Signed)
Medical screening examination/treatment/procedure(s) were performed by non-physician practitioner and as supervising physician I was immediately available for consultation/collaboration.  Josph Norfleet, MD 09/26/12 0847 

## 2012-09-27 ENCOUNTER — Encounter (HOSPITAL_BASED_OUTPATIENT_CLINIC_OR_DEPARTMENT_OTHER): Payer: Self-pay | Admitting: *Deleted

## 2012-09-27 ENCOUNTER — Emergency Department (HOSPITAL_BASED_OUTPATIENT_CLINIC_OR_DEPARTMENT_OTHER)
Admission: EM | Admit: 2012-09-27 | Discharge: 2012-09-27 | Disposition: A | Discharge status: Home / Self Care | Payer: Self-pay | Attending: Emergency Medicine | Admitting: Emergency Medicine

## 2012-09-27 DIAGNOSIS — M549 Dorsalgia, unspecified: Secondary | ICD-10-CM | POA: Insufficient documentation

## 2012-09-27 DIAGNOSIS — R21 Rash and other nonspecific skin eruption: Secondary | ICD-10-CM | POA: Insufficient documentation

## 2012-09-27 DIAGNOSIS — R109 Unspecified abdominal pain: Secondary | ICD-10-CM | POA: Insufficient documentation

## 2012-09-27 DIAGNOSIS — E079 Disorder of thyroid, unspecified: Secondary | ICD-10-CM | POA: Insufficient documentation

## 2012-09-27 DIAGNOSIS — R350 Frequency of micturition: Secondary | ICD-10-CM | POA: Insufficient documentation

## 2012-09-27 DIAGNOSIS — Z3202 Encounter for pregnancy test, result negative: Secondary | ICD-10-CM | POA: Insufficient documentation

## 2012-09-27 DIAGNOSIS — N289 Disorder of kidney and ureter, unspecified: Secondary | ICD-10-CM | POA: Insufficient documentation

## 2012-09-27 DIAGNOSIS — M109 Gout, unspecified: Secondary | ICD-10-CM | POA: Insufficient documentation

## 2012-09-27 DIAGNOSIS — M25519 Pain in unspecified shoulder: Secondary | ICD-10-CM | POA: Insufficient documentation

## 2012-09-27 DIAGNOSIS — Z87891 Personal history of nicotine dependence: Secondary | ICD-10-CM | POA: Insufficient documentation

## 2012-09-27 DIAGNOSIS — Z8614 Personal history of Methicillin resistant Staphylococcus aureus infection: Secondary | ICD-10-CM | POA: Insufficient documentation

## 2012-09-27 DIAGNOSIS — B9689 Other specified bacterial agents as the cause of diseases classified elsewhere: Secondary | ICD-10-CM

## 2012-09-27 DIAGNOSIS — A6000 Herpesviral infection of urogenital system, unspecified: Secondary | ICD-10-CM | POA: Insufficient documentation

## 2012-09-27 DIAGNOSIS — N760 Acute vaginitis: Secondary | ICD-10-CM | POA: Insufficient documentation

## 2012-09-27 DIAGNOSIS — IMO0001 Reserved for inherently not codable concepts without codable children: Secondary | ICD-10-CM | POA: Insufficient documentation

## 2012-09-27 DIAGNOSIS — Z79899 Other long term (current) drug therapy: Secondary | ICD-10-CM | POA: Insufficient documentation

## 2012-09-27 DIAGNOSIS — IMO0002 Reserved for concepts with insufficient information to code with codable children: Secondary | ICD-10-CM | POA: Insufficient documentation

## 2012-09-27 DIAGNOSIS — R300 Dysuria: Secondary | ICD-10-CM | POA: Insufficient documentation

## 2012-09-27 DIAGNOSIS — E119 Type 2 diabetes mellitus without complications: Secondary | ICD-10-CM | POA: Insufficient documentation

## 2012-09-27 DIAGNOSIS — N898 Other specified noninflammatory disorders of vagina: Secondary | ICD-10-CM | POA: Insufficient documentation

## 2012-09-27 LAB — BASIC METABOLIC PANEL
BUN: 17 mg/dL (ref 6–23)
Calcium: 9.6 mg/dL (ref 8.4–10.5)
Creatinine, Ser: 1.2 mg/dL — ABNORMAL HIGH (ref 0.50–1.10)
GFR calc non Af Amer: 53 mL/min — ABNORMAL LOW (ref >90)
Glucose, Bld: 94 mg/dL (ref 70–99)

## 2012-09-27 LAB — CBC WITH DIFFERENTIAL/PLATELET
Eosinophils Absolute: 0.1 10*3/uL (ref 0.0–0.7)
Eosinophils Relative: 1 % (ref 0–5)
Hemoglobin: 9.4 g/dL — ABNORMAL LOW (ref 12.0–15.0)
Lymphs Abs: 3.7 10*3/uL (ref 0.7–4.0)
MCH: 28.2 pg (ref 26.0–34.0)
MCHC: 32 g/dL (ref 30.0–36.0)
MCV: 88.3 fL (ref 78.0–100.0)
Monocytes Absolute: 0.5 10*3/uL (ref 0.1–1.0)
Monocytes Relative: 6 % (ref 3–12)
RBC: 3.33 MIL/uL — ABNORMAL LOW (ref 3.87–5.11)

## 2012-09-27 LAB — URINALYSIS, ROUTINE W REFLEX MICROSCOPIC
Bilirubin Urine: NEGATIVE
Hgb urine dipstick: NEGATIVE
Ketones, ur: NEGATIVE mg/dL
Protein, ur: NEGATIVE mg/dL
Specific Gravity, Urine: 1.016 (ref 1.005–1.030)
Urobilinogen, UA: 0.2 mg/dL (ref 0.0–1.0)

## 2012-09-27 LAB — WET PREP, GENITAL: Yeast Wet Prep HPF POC: NONE SEEN

## 2012-09-27 LAB — URINE MICROSCOPIC-ADD ON

## 2012-09-27 MED ORDER — METRONIDAZOLE 500 MG PO TABS: 500.0000 mg | ORAL_TABLET | Freq: Two times a day (BID) | ORAL | Status: AC

## 2012-09-27 MED ORDER — ACYCLOVIR 400 MG PO TABS: 400.0000 mg | ORAL_TABLET | Freq: Three times a day (TID) | ORAL | Status: AC

## 2012-09-27 NOTE — ED Notes (Signed)
rx x 2 given for flagyl and acyclovir

## 2012-09-27 NOTE — ED Provider Notes (Signed)
CSN: [id number]     Arrival date & time 09/27/12  2026 History   First MD Initiated Contact with Patient 09/27/12 2156     Chief Complaint  Patient presents with  . Joint Swelling   (Consider location/radiation/quality/duration/timing/severity/associated sxs/prior Treatment) HPI Comments: Patient presents with lower abdominal and back pain. She was seen here on August 24 for similar symptoms time but was having more urinary frequency and dysuria that time. She was felt to have pyelonephritis and was started on Cipro. She states that she took about 5 days of the Cipro and started having swelling of her hands and feet with tingly feeling in her hands and feet and achiness in her hands and feet as well as her lower legs. She denies any fevers or chills. She still has a little bit of the lower abdominal and back pain. She denies a fevers or chills. She's also noted some sores that developed on her lower lip in her genital area. She does have a history of genital herpes. She's had some vaginal discharge but no bleeding. She denies any nausea vomiting or diarrhea.   Past Medical History  Diagnosis Date  . Thyroid disease   . Gout   . Arthritis   . Fibromyalgia   . MRSA (methicillin resistant Staphylococcus aureus)   . Diabetes mellitus without complication    Past Surgical History  Procedure Laterality Date  . Appendectomy    . Cholecystectomy    . Tonsillectomy     No family history on file. History  Substance Use Topics  . Smoking status: Former Games developer  . Smokeless tobacco: Not on file  . Alcohol Use: No   OB History   Grav Para Term Preterm Abortions TAB SAB Ect Mult Living                 Review of Systems  Constitutional: Negative for fever, chills, diaphoresis and fatigue.  HENT: Negative for congestion, rhinorrhea and sneezing.   Eyes: Negative.   Respiratory: Negative for cough, chest tightness and shortness of breath.   Cardiovascular: Negative for chest pain and leg  swelling.  Gastrointestinal: Positive for abdominal pain. Negative for nausea, vomiting, diarrhea and blood in stool.  Genitourinary: Positive for dysuria, frequency, vaginal discharge and genital sores. Negative for hematuria, flank pain, vaginal bleeding and difficulty urinating.  Musculoskeletal: Positive for back pain, joint swelling and arthralgias.  Skin: Positive for rash.  Neurological: Negative for dizziness, speech difficulty, weakness, numbness and headaches.    Allergies  Almond oil; Apple; Avocado; Banana; Cabbage; Carrot; Eggs or egg-derived products; Milk-related compounds; Orange fruit; Peach; Shellfish allergy; Strawberry; Tomato; Yeast-related products; Ultracet; and Ultram  Home Medications   Current Outpatient Rx  Name  Route  Sig  Dispense  Refill  . acyclovir (ZOVIRAX) 400 MG tablet   Oral   Take 1 tablet (400 mg total) by mouth 3 (three) times daily.   15 tablet   0   . albuterol (PROVENTIL HFA;VENTOLIN HFA) 108 (90 BASE) MCG/ACT inhaler   Inhalation   Inhale 2 puffs into the lungs every 6 (six) hours as needed. For wheezing or shortness of breath          . cephALEXin (KEFLEX) 500 MG capsule   Oral   Take 1 capsule (500 mg total) by mouth 4 (four) times daily.   40 capsule   0   . ciprofloxacin (CIPRO) 500 MG tablet   Oral   Take 1 tablet (500 mg total) by mouth 2 (  two) times daily.   14 tablet   0   . cyclobenzaprine (FLEXERIL) 10 MG tablet   Oral   Take 10 mg by mouth 3 (three) times daily as needed. For muscle spasms           . DULoxetine (CYMBALTA) 60 MG capsule   Oral   Take 60 mg by mouth daily.           Marland Kitchen EPINEPHrine (EPIPEN) 0.3 mg/0.3 mL DEVI   Intramuscular   Inject 0.3 mg into the muscle once.         . ferrous sulfate 325 (65 FE) MG tablet   Oral   Take 325 mg by mouth daily.           Marland Kitchen gabapentin (NEURONTIN) 300 MG capsule   Oral   Take 300 mg by mouth 1 day or 1 dose.         Marland Kitchen HYDROcodone-acetaminophen  (HYCET) 7.5-325 mg/15 ml solution   Oral   Take 15 mLs by mouth 4 (four) times daily as needed for pain.   120 mL   0   . ibuprofen (ADVIL,MOTRIN) 800 MG tablet   Oral   Take 1 tablet (800 mg total) by mouth 3 (three) times daily.   21 tablet   0   . lansoprazole (PREVACID) 30 MG capsule   Oral   Take 60 mg by mouth daily.           Marland Kitchen levothyroxine (SYNTHROID, LEVOTHROID) 175 MCG tablet   Oral   Take 137 mcg by mouth daily.          . methocarbamol (ROBAXIN) 500 MG tablet   Oral   Take 1 tablet (500 mg total) by mouth 2 (two) times daily.   20 tablet   0   . metoCLOPramide (REGLAN) 10 MG tablet   Oral   Take 10 mg by mouth daily.           . metroNIDAZOLE (FLAGYL) 500 MG tablet   Oral   Take 1 tablet (500 mg total) by mouth 2 (two) times daily. One po bid x 7 days   14 tablet   0   . omeprazole (PRILOSEC) 40 MG capsule   Oral   Take 40 mg by mouth daily.         . phentermine (ADIPEX-P) 37.5 MG tablet   Oral   Take 37.5 mg by mouth daily before breakfast.         . predniSONE (DELTASONE) 10 MG tablet   Oral   Take 2 tablets (20 mg total) by mouth daily.   15 tablet   0   . probenecid (BENEMID) 500 MG tablet   Oral   Take 500 mg by mouth 2 (two) times daily.           . promethazine (PHENERGAN) 25 MG tablet   Oral   Take 1 tablet (25 mg total) by mouth every 6 (six) hours as needed for nausea.   12 tablet   0   . sulfamethoxazole-trimethoprim (SEPTRA DS) 800-160 MG per tablet   Oral   Take 2 tablets by mouth 2 (two) times daily.   40 tablet   0    BP 136/79  Pulse 68  Temp(Src) 97.7 F (36.5 C) (Oral)  Resp 16  Ht 5\' 6"  (1.676 m)  Wt 239 lb (108.41 kg)  BMI 38.59 kg/m2  SpO2 100% Physical Exam  Constitutional: She is oriented to person, place,  and time. She appears well-developed and well-nourished.  HENT:  Head: Normocephalic and atraumatic.  Mouth/Throat: Oropharynx is clear and moist.  She has some small blisters on her  lower lip. There is no intraoral blisters  Eyes: Pupils are equal, round, and reactive to light.  Neck: Normal range of motion. Neck supple.  Cardiovascular: Normal rate, regular rhythm and normal heart sounds.   Pulmonary/Chest: Effort normal and breath sounds normal. No respiratory distress. She has no wheezes. She has no rales. She exhibits no tenderness.  Abdominal: Soft. Bowel sounds are normal. There is tenderness (Mild tenderness to suprapubic area). There is no rebound and no guarding.  Genitourinary:  There are some blisters in the labia, likely to be consistent with likely general herpes.  There is a moderate amount of weight vaginal discharge. There some tenderness to the suprapubic area and examined no specific adnexal tenderness or cervical motion tenderness  Musculoskeletal: Normal range of motion. She exhibits edema.  There is some mild edema to the hands bilaterally. There's no warmth or erythema. No significant edema noted to the legs  Lymphadenopathy:    She has no cervical adenopathy.  Neurological: She is alert and oriented to person, place, and time.  Skin: Skin is warm and dry. No rash noted.  Psychiatric: She has a normal mood and affect.    ED Course  Procedures (including critical care time) Labs Review Results for orders placed during the hospital encounter of 09/27/12  WET PREP, GENITAL      Result Value Range   Yeast Wet Prep HPF POC NONE SEEN  NONE SEEN   Trich, Wet Prep NONE SEEN  NONE SEEN   Clue Cells Wet Prep HPF POC MODERATE (*) NONE SEEN   WBC, Wet Prep HPF POC FEW (*) NONE SEEN  CBC WITH DIFFERENTIAL      Result Value Range   WBC 7.8  4.0 - 10.5 K/uL   RBC 3.33 (*) 3.87 - 5.11 MIL/uL   Hemoglobin 9.4 (*) 12.0 - 15.0 g/dL   HCT 09.8 (*) 11.9 - 14.7 %   MCV 88.3  78.0 - 100.0 fL   MCH 28.2  26.0 - 34.0 pg   MCHC 32.0  30.0 - 36.0 g/dL   RDW 82.9  56.2 - 13.0 %   Platelets 267  150 - 400 K/uL   Neutrophils Relative % 46  43 - 77 %   Neutro Abs  3.6  1.7 - 7.7 K/uL   Lymphocytes Relative 47 (*) 12 - 46 %   Lymphs Abs 3.7  0.7 - 4.0 K/uL   Monocytes Relative 6  3 - 12 %   Monocytes Absolute 0.5  0.1 - 1.0 K/uL   Eosinophils Relative 1  0 - 5 %   Eosinophils Absolute 0.1  0.0 - 0.7 K/uL   Basophils Relative 0  0 - 1 %   Basophils Absolute 0.0  0.0 - 0.1 K/uL  BASIC METABOLIC PANEL      Result Value Range   Sodium 140  135 - 145 mEq/L   Potassium 3.8  3.5 - 5.1 mEq/L   Chloride 102  96 - 112 mEq/L   CO2 28  19 - 32 mEq/L   Glucose, Bld 94  70 - 99 mg/dL   BUN 17  6 - 23 mg/dL   Creatinine, Ser 8.65 (*) 0.50 - 1.10 mg/dL   Calcium 9.6  8.4 - 78.4 mg/dL   GFR calc non Af Amer 53 (*) >90 mL/min  GFR calc Af Amer 61 (*) >90 mL/min  URINALYSIS, ROUTINE W REFLEX MICROSCOPIC      Result Value Range   Color, Urine YELLOW  YELLOW   APPearance CLEAR  CLEAR   Specific Gravity, Urine 1.016  1.005 - 1.030   pH 6.0  5.0 - 8.0   Glucose, UA NEGATIVE  NEGATIVE mg/dL   Hgb urine dipstick NEGATIVE  NEGATIVE   Bilirubin Urine NEGATIVE  NEGATIVE   Ketones, ur NEGATIVE  NEGATIVE mg/dL   Protein, ur NEGATIVE  NEGATIVE mg/dL   Urobilinogen, UA 0.2  0.0 - 1.0 mg/dL   Nitrite NEGATIVE  NEGATIVE   Leukocytes, UA TRACE (*) NEGATIVE  PREGNANCY, URINE      Result Value Range   Preg Test, Ur NEGATIVE  NEGATIVE  URINE MICROSCOPIC-ADD ON      Result Value Range   Squamous Epithelial / LPF RARE  RARE   WBC, UA 0-2  <3 WBC/hpf   RBC / HPF 0-2  <3 RBC/hpf   Bacteria, UA RARE  RARE   No results found.   Imaging Review No results found.  MDM   1. BV (bacterial vaginosis)   2. Genital herpes   3. Renal insufficiency    Patient has evidence of bacterial vaginosis. I will go ahead and treat her with Flagyl for this. She also has anemia which appears to be chronic based on her previous lab values. Her creatinine was mildly elevated and she has been told that she has stage II kidney disease by her primary care physician. Her past creatinine in  our charts were normal but she was told on her last visit that it was abnormal. I advised her that she'll need a followup for recheck on this. Otherwise her electrolytes look okay. I feel occurs swelling of her hands and feet with a joint tenderness his possibly from the Cipro. She's currently stopped the Cipro. GC and Chlamydia were sent. I'll start her on acyclovir for the genital herpes outbreak.    Rolan Bucco, MD 09/27/12 2330

## 2012-09-27 NOTE — ED Notes (Signed)
Joint pain and back pain.

## 2012-09-28 LAB — HIV ANTIBODY (ROUTINE TESTING W REFLEX): HIV: NONREACTIVE

## 2012-09-28 LAB — RPR: RPR Ser Ql: NONREACTIVE

## 2012-10-22 ENCOUNTER — Ambulatory Visit: Payer: Self-pay | Attending: Internal Medicine | Admitting: Internal Medicine

## 2012-10-22 VITALS — BP 155/83 | HR 73 | Temp 97.7°F | Resp 16 | Wt 244.0 lb

## 2012-10-22 DIAGNOSIS — J069 Acute upper respiratory infection, unspecified: Secondary | ICD-10-CM | POA: Insufficient documentation

## 2012-10-22 DIAGNOSIS — M129 Arthropathy, unspecified: Secondary | ICD-10-CM | POA: Insufficient documentation

## 2012-10-22 DIAGNOSIS — I129 Hypertensive chronic kidney disease with stage 1 through stage 4 chronic kidney disease, or unspecified chronic kidney disease: Secondary | ICD-10-CM | POA: Insufficient documentation

## 2012-10-22 DIAGNOSIS — N189 Chronic kidney disease, unspecified: Secondary | ICD-10-CM | POA: Insufficient documentation

## 2012-10-22 DIAGNOSIS — M1990 Unspecified osteoarthritis, unspecified site: Secondary | ICD-10-CM

## 2012-10-22 DIAGNOSIS — Z79899 Other long term (current) drug therapy: Secondary | ICD-10-CM | POA: Insufficient documentation

## 2012-10-22 DIAGNOSIS — E039 Hypothyroidism, unspecified: Secondary | ICD-10-CM | POA: Insufficient documentation

## 2012-10-22 DIAGNOSIS — N183 Chronic kidney disease, stage 3 (moderate): Secondary | ICD-10-CM

## 2012-10-22 DIAGNOSIS — E119 Type 2 diabetes mellitus without complications: Secondary | ICD-10-CM | POA: Insufficient documentation

## 2012-10-22 DIAGNOSIS — I10 Essential (primary) hypertension: Secondary | ICD-10-CM

## 2012-10-22 LAB — CBC WITH DIFFERENTIAL/PLATELET
Basophils Absolute: 0 10*3/uL (ref 0.0–0.1)
Basophils Relative: 0 % (ref 0–1)
Eosinophils Relative: 1 % (ref 0–5)
HCT: 31.8 % — ABNORMAL LOW (ref 36.0–46.0)
MCH: 27.9 pg (ref 26.0–34.0)
MCHC: 32.7 g/dL (ref 30.0–36.0)
MCV: 85.3 fL (ref 78.0–100.0)
Monocytes Absolute: 0.5 10*3/uL (ref 0.1–1.0)
RDW: 15.2 % (ref 11.5–15.5)

## 2012-10-22 LAB — CMP AND LIVER
ALT: 24 U/L (ref 0–35)
Bilirubin, Direct: 0.1 mg/dL (ref 0.0–0.3)
CO2: 26 mEq/L (ref 19–32)
Calcium: 10.6 mg/dL — ABNORMAL HIGH (ref 8.4–10.5)
Chloride: 103 mEq/L (ref 96–112)
Creat: 0.98 mg/dL (ref 0.50–1.10)
Total Protein: 8.2 g/dL (ref 6.0–8.3)

## 2012-10-22 LAB — LIPID PANEL: LDL Cholesterol: 81 mg/dL (ref 0–99)

## 2012-10-22 MED ORDER — AZITHROMYCIN 500 MG PO TABS: 500.0000 mg | ORAL_TABLET | Freq: Every day | ORAL | Status: AC

## 2012-10-22 MED ORDER — GUAIFENESIN-DM 100-10 MG/5ML PO SYRP: 5.0000 mL | ORAL_SOLUTION | Freq: Three times a day (TID) | ORAL | Status: AC | PRN

## 2012-10-22 NOTE — Progress Notes (Signed)
Patient ID: Kristine Ramos, female   DOB: February 28, 1965, 47 y.o.   MRN: [id number] Patient Demographics  Kristine Ramos, is a 47 y.o. female  [id number]  [id number]  DOB - October 18, 1965  CC:  Chief Complaint  Patient presents with  . Follow-up       HPI: Kristine Ramos is a 47 y.o. female here today to establish medical care. She was seen in an urgent care in [facility] recently after being treated for pyelonephritis, she was told that her kidney functions is abnormal and need to followup with primary care physician. She has history of hypertension and arthritis, hypothyroidism, questionable fibromyalgia and prediabetes. She is on medication for hypertension but she could not remember the name and she did not bring the bottle to the clinic today. She has no complaint today, she is making good urine, eating and drinking okay without restrictions, has pain in generally, not worse than usual. She is on other medications as listed below. She does not smoke cigarette, she drinks alcohol only occasionally. She has multiple allergies as listed below. She has also been coughing for about 3 weeks, with runny nose, and occasional chest pain while coughing. Patient has No headache, No chest pain, No abdominal pain - No Nausea, No new weakness tingling or numbness, No Cough - SOB.  Allergies  Allergen Reactions  . Almond Oil Other (See Comments)    Positive rxn to allergy test  . Apple Other (See Comments)    Positive rxn to allergy test  . Avocado Other (See Comments)    Positive rxn to allergy test  . Banana Other (See Comments)    Positive rxn to allergy test  . Cabbage Other (See Comments)    Positive rxn to allergy test  . Carrot [Daucus Carota] Other (See Comments)    Positive rxn to allergy test  . Eggs Or Egg-Derived Products Other (See Comments)    Positive rxn to allergy test  . Milk-Related Compounds Other (See Comments)    Positive rxn to allergy test  . Orange Fruit [Citrus]  Other (See Comments)    Positive rxn to allergy test  . Peach [Prunus Persica] Other (See Comments)    Positive rxn to allergy test  . Shellfish Allergy Other (See Comments)    Positive rxn to allergy test  . Strawberry Other (See Comments)    Positive rxn to allergy test  . Tomato Nausea And Vomiting and Other (See Comments)    Positive rxn to allergy test  . Yeast-Related Products Other (See Comments)    Positive rxn to allergy test  . Ultracet [Tramadol-Acetaminophen] Rash  . Ultram [Tramadol Hcl] Rash   Past Medical History  Diagnosis Date  . Thyroid disease   . Gout   . Arthritis   . Fibromyalgia   . MRSA (methicillin resistant Staphylococcus aureus)   . Diabetes mellitus without complication    Current Outpatient Prescriptions on File Prior to Visit  Medication Sig Dispense Refill  . acyclovir (ZOVIRAX) 400 MG tablet Take 1 tablet (400 mg total) by mouth 3 (three) times daily.  15 tablet  0  . albuterol (PROVENTIL HFA;VENTOLIN HFA) 108 (90 BASE) MCG/ACT inhaler Inhale 2 puffs into the lungs every 6 (six) hours as needed. For wheezing or shortness of breath       . cephALEXin (KEFLEX) 500 MG capsule Take 1 capsule (500 mg total) by mouth 4 (four) times daily.  40 capsule  0  . ciprofloxacin (CIPRO) 500 MG tablet Take 1  tablet (500 mg total) by mouth 2 (two) times daily.  14 tablet  0  . cyclobenzaprine (FLEXERIL) 10 MG tablet Take 10 mg by mouth 3 (three) times daily as needed. For muscle spasms        . DULoxetine (CYMBALTA) 60 MG capsule Take 60 mg by mouth daily.        Marland Kitchen EPINEPHrine (EPIPEN) 0.3 mg/0.3 mL DEVI Inject 0.3 mg into the muscle once.      . ferrous sulfate 325 (65 FE) MG tablet Take 325 mg by mouth daily.        Marland Kitchen gabapentin (NEURONTIN) 300 MG capsule Take 300 mg by mouth 1 day or 1 dose.      Marland Kitchen HYDROcodone-acetaminophen (HYCET) 7.5-325 mg/15 ml solution Take 15 mLs by mouth 4 (four) times daily as needed for pain.  120 mL  0  . ibuprofen (ADVIL,MOTRIN) 800  MG tablet Take 1 tablet (800 mg total) by mouth 3 (three) times daily.  21 tablet  0  . lansoprazole (PREVACID) 30 MG capsule Take 60 mg by mouth daily.        Marland Kitchen levothyroxine (SYNTHROID, LEVOTHROID) 175 MCG tablet Take 137 mcg by mouth daily.       . methocarbamol (ROBAXIN) 500 MG tablet Take 1 tablet (500 mg total) by mouth 2 (two) times daily.  20 tablet  0  . metoCLOPramide (REGLAN) 10 MG tablet Take 10 mg by mouth daily.        . metroNIDAZOLE (FLAGYL) 500 MG tablet Take 1 tablet (500 mg total) by mouth 2 (two) times daily. One po bid x 7 days  14 tablet  0  . omeprazole (PRILOSEC) 40 MG capsule Take 40 mg by mouth daily.      . phentermine (ADIPEX-P) 37.5 MG tablet Take 37.5 mg by mouth daily before breakfast.      . predniSONE (DELTASONE) 10 MG tablet Take 2 tablets (20 mg total) by mouth daily.  15 tablet  0  . probenecid (BENEMID) 500 MG tablet Take 500 mg by mouth 2 (two) times daily.        . promethazine (PHENERGAN) 25 MG tablet Take 1 tablet (25 mg total) by mouth every 6 (six) hours as needed for nausea.  12 tablet  0  . sulfamethoxazole-trimethoprim (SEPTRA DS) 800-160 MG per tablet Take 2 tablets by mouth 2 (two) times daily.  40 tablet  0   No current facility-administered medications on file prior to visit.   History reviewed. No pertinent family history. History   Social History  . Marital Status: Divorced    Spouse Name: N/A    Number of Children: N/A  . Years of Education: N/A   Occupational History  . Not on file.   Social History Main Topics  . Smoking status: Former Games developer  . Smokeless tobacco: Not on file  . Alcohol Use: No  . Drug Use: No  . Sexual Activity: Not on file   Other Topics Concern  . Not on file   Social History Narrative  . No narrative on file    Review of Systems: Constitutional: Negative for fever, chills, diaphoresis, activity change, appetite change and fatigue. HENT: Negative for ear pain, nosebleeds, congestion, facial swelling,  rhinorrhea, neck pain, neck stiffness and ear discharge.  Eyes: Negative for pain, discharge, redness, itching and visual disturbance. Respiratory: Negative for cough, choking, chest tightness, shortness of breath, wheezing and stridor.  Cardiovascular: Negative for chest pain, palpitations and leg swelling. Gastrointestinal: Negative  for abdominal distention. Genitourinary: Negative for dysuria, urgency, frequency, hematuria, flank pain, decreased urine volume, difficulty urinating and dyspareunia.  Musculoskeletal: Negative for back pain, joint swelling, arthralgia and gait problem. Neurological: Negative for dizziness, tremors, seizures, syncope, facial asymmetry, speech difficulty, weakness, light-headedness, numbness and headaches.  Hematological: Negative for adenopathy. Does not bruise/bleed easily. Psychiatric/Behavioral: Negative for hallucinations, behavioral problems, confusion, dysphoric mood, decreased concentration and agitation.    Objective:   Filed Vitals:   10/22/12 1753  BP: 155/83  Pulse: 73  Temp: 97.7 F (36.5 C)  Resp: 16    Physical Exam: Constitutional: Patient appears well-developed and well-nourished. No distress. HENT: Normocephalic, atraumatic, External right and left ear normal. Oropharynx is clear and moist.  Eyes: Conjunctivae and EOM are normal. PERRLA, no scleral icterus. Neck: Normal ROM. Neck supple. No JVD. No tracheal deviation. No thyromegaly. CVS: RRR, S1/S2 +, no murmurs, no gallops, no carotid bruit.  Pulmonary: Effort and breath sounds normal, no stridor, rhonchi, wheezes, rales.  Abdominal: Soft. BS +, no distension, tenderness, rebound or guarding.  Musculoskeletal: Normal range of motion. No edema and no tenderness.  Lymphadenopathy: No lymphadenopathy noted, cervical, inguinal or axillary Neuro: Alert. Normal reflexes, muscle tone coordination. No cranial nerve deficit. Skin: Skin is warm and dry. No rash noted. Not diaphoretic. No  erythema. No pallor. Psychiatric: Normal mood and affect. Behavior, judgment, thought content normal.  Lab Results  Component Value Date   WBC 7.8 09/27/2012   HGB 9.4* 09/27/2012   HCT 29.4* 09/27/2012   MCV 88.3 09/27/2012   PLT 267 09/27/2012   Lab Results  Component Value Date   CREATININE 1.20* 09/27/2012   BUN 17 09/27/2012   NA 140 09/27/2012   K 3.8 09/27/2012   CL 102 09/27/2012   CO2 28 09/27/2012    Lab Results  Component Value Date   HGBA1C  Value: 5.5 (NOTE)                                                                       According to the ADA Clinical Practice Recommendations for 2011, when HbA1c is used as a screening test:   >=6.5%   Diagnostic of Diabetes Mellitus           (if abnormal result  is confirmed)  5.7-6.4%   Increased risk of developing Diabetes Mellitus  References:Diagnosis and Classification of Diabetes Mellitus,Diabetes Care,2011,34(Suppl 1):S62-S69 and Standards of Medical Care in         Diabetes - 2011,Diabetes Care,2011,34  (Suppl 1):S11-S61. 08/11/2009   Lipid Panel     Component Value Date/Time   CHOL  Value: 145        ATP III CLASSIFICATION:  <200     mg/dL   Desirable  166-063  mg/dL   Borderline High  >=016    mg/dL   High        0/10/9321 0600   TRIG 101 08/11/2009 0600   HDL 45 08/11/2009 0600   CHOLHDL 3.2 08/11/2009 0600   VLDL 20 08/11/2009 0600   LDLCALC  Value: 80        Total Cholesterol/HDL:CHD Risk Coronary Heart Disease Risk Table  Men   Women  1/2 Average Risk   3.4   3.3  Average Risk       5.0   4.4  2 X Average Risk   9.6   7.1  3 X Average Risk  23.4   11.0        Use the calculated Patient Ratio above and the CHD Risk Table to determine the patient's CHD Risk.        ATP III CLASSIFICATION (LDL):  <100     mg/dL   Optimal  409-811  mg/dL   Near or Above                    Optimal  130-159  mg/dL   Borderline  914-782  mg/dL   High  >956     mg/dL   Very High 03/15/863 7846       Assessment and plan:   Patient  Active Problem List   Diagnosis Date Noted  . Unspecified hypothyroidism 10/22/2012  . Essential hypertension, benign 10/22/2012  . Arthritis 10/22/2012  . CKD (chronic kidney disease) 10/22/2012  . Acute upper respiratory infections of unspecified site 10/22/2012    Plan: Comprehensive metabolic panel Complete blood count and depressions Lipid panel Thyroid function test Urine microalbumin and creatinine ratio Hemoglobin A1c  Zithromax for upper respiratory tract infection prescribed Robitussin expectorant  Patient has been extensively counseled and educated about hypertension and its complications We will call patient with results of laboratory tests.     Health Maintenance To be scheduled   Follow up in 4 weeks   The patient was given clear instructions to go to ER or return to medical center if symptoms don't improve, worsen or new problems develop. The patient verbalized understanding. The patient was told to call to get lab results if they haven't heard anything in the next week.   Jeanann Lewandowsky, MD, MHA, [facility] [place], [place] [phone]   10/22/2012, 6:55 PM

## 2012-10-22 NOTE — Progress Notes (Signed)
Patient was referred here by anther dr  Was seen there for back pain Having left heel pain

## 2012-10-23 LAB — MICROALBUMIN / CREATININE URINE RATIO
Microalb Creat Ratio: 12 mg/g (ref 0.0–30.0)
Microalb, Ur: 1.69 mg/dL (ref 0.00–1.89)

## 2012-10-23 LAB — HEMOGLOBIN A1C: Hgb A1c MFr Bld: 6.5 % — ABNORMAL HIGH (ref <5.7)

## 2012-11-04 ENCOUNTER — Telehealth: Payer: Self-pay | Admitting: Emergency Medicine

## 2012-11-04 NOTE — Telephone Encounter (Signed)
Left message for pt to schedule appt to discuss lab results

## 2012-11-04 NOTE — Telephone Encounter (Signed)
Message copied by Darlis Loan on Mon Nov 04, 2012 12:57 PM ------      Message from: Quentin Angst      Created: Mon Nov 04, 2012 12:43 PM       Please schedule an appointment with patient to discuss her lab results and further management ------

## 2012-11-15 ENCOUNTER — Ambulatory Visit: Payer: Self-pay | Attending: Internal Medicine | Admitting: Internal Medicine

## 2012-11-15 VITALS — BP 136/85 | HR 67 | Temp 98.2°F | Resp 18

## 2012-11-15 DIAGNOSIS — E119 Type 2 diabetes mellitus without complications: Secondary | ICD-10-CM | POA: Insufficient documentation

## 2012-11-15 DIAGNOSIS — E039 Hypothyroidism, unspecified: Secondary | ICD-10-CM | POA: Insufficient documentation

## 2012-11-15 MED ORDER — METFORMIN HCL 500 MG PO TABS: 500.0000 mg | ORAL_TABLET | Freq: Two times a day (BID) | ORAL | Status: AC

## 2012-11-15 MED ORDER — ALCOHOL SWABS 70 % PADS: 1.0000 "application " | MEDICATED_PAD | Freq: Every day | Status: AC

## 2012-11-15 MED ORDER — ACCU-CHEK MULTICLIX LANCETS MISC: Status: AC

## 2012-11-15 MED ORDER — LEVOTHYROXINE SODIUM 150 MCG PO TABS: 150.0000 ug | ORAL_TABLET | Freq: Every day | ORAL | Status: AC

## 2012-11-15 MED ORDER — GLUCOSE BLOOD VI STRP: ORAL_STRIP | Status: AC

## 2012-11-15 NOTE — Addendum Note (Signed)
Addended by: Eddie North on: 11/15/2012 06:25 PM   Modules accepted: Orders

## 2012-11-15 NOTE — Progress Notes (Signed)
Patient here for follow up-DM And to discuss results of her last labs

## 2012-11-15 NOTE — Progress Notes (Signed)
Patient ID: Kristine Ramos, female   DOB: February 15, 1965, 47 y.o.   MRN: [id number]  HPI 47 year old female with history of hypothyroidism, fibromyalgia if for followup visit. Recent lab work done shows an A1c of 6.5 suggestive of new-onset diabetes. Patient does need it she was previously told of being prediabetic. She has not been compliant with her diet. As has been consistent with her thyroid medications. She denies headache, blurred vision, dizziness, chest pain, palpitations, shortness of breath, abdominal pain, behavior, polydipsia, dysuria. She reports being fatigued most of the time.   Vital signs in last 24 hours:  Filed Vitals:   11/15/12 1719  BP: 136/85  Pulse: 67  Temp: 98.2 F (36.8 C)  Resp: 18  SpO2: 100%      Physical Exam:  General: Middle aged female in no acute distress. HEENT: no pallor, no icterus, moist oral mucosa, neck supple Heart: Normal  s1 &s2  Regular rate and rhythm, without murmurs, rubs, gallops. Lungs: Clear to auscultation bilaterally. Abdomen: Soft, nontender, nondistended, positive bowel sounds. Extremities: Warm, no edema Neuro: Alert, awake, oriented x3, nonfocal.   Lab Results:  Basic Metabolic Panel:    Component Value Date/Time   NA 142 10/22/2012 1858   K 3.6 10/22/2012 1858   CL 103 10/22/2012 1858   CO2 26 10/22/2012 1858   BUN 16 10/22/2012 1858   CREATININE 0.98 10/22/2012 1858   CREATININE 1.20* 09/27/2012 2222   GLUCOSE 83 10/22/2012 1858   CALCIUM 10.6* 10/22/2012 1858   CBC:    Component Value Date/Time   WBC 7.9 10/22/2012 1858   HGB 10.4* 10/22/2012 1858   HCT 31.8* 10/22/2012 1858   PLT 288 10/22/2012 1858   MCV 85.3 10/22/2012 1858   NEUTROABS 3.2 10/22/2012 1858   LYMPHSABS 4.0 10/22/2012 1858   MONOABS 0.5 10/22/2012 1858   EOSABS 0.1 10/22/2012 1858   BASOSABS 0.0 10/22/2012 1858    No results found for this or any previous visit (from the past 240 hour(s)).  Studies/Results: No results  found.  Medications:   Assessment/Plan: Hypothyroidism Patient has been noncompliant with her medications. I have instructed her to take her medications regularly. Recheck thyroid function in 3 months  New-onset diabetes mellitus I will start her on metformin 500 mg twice daily. We'll prescribe a glucometer and test strips patient is to keep a log of her blood sugar at home and bring it getting her next visit. Followup in 3 months Counseled on sugar restriction and diet, medication adherence and exercise to lose weight.   Health maintenance With for to GYN for Pap and mammogram Patient refused flu vaccine  Followup in 3 months  Montgomery Favor 11/15/2012, 5:43 PM

## 2013-02-17 ENCOUNTER — Ambulatory Visit: Payer: Medicaid Other | Admitting: Internal Medicine

## 2013-03-05 ENCOUNTER — Ambulatory Visit: Payer: Medicaid Other | Admitting: Internal Medicine

## 2013-03-05 NOTE — Progress Notes (Deleted)
HPI: Kristine Ramos is a 48 y.o. female presenting on 03/05/2013 with   Current Outpatient Prescriptions  Medication Sig Dispense Refill  . acyclovir (ZOVIRAX) 400 MG tablet Take 1 tablet (400 mg total) by mouth 3 (three) times daily.  15 tablet  0  . albuterol (PROVENTIL HFA;VENTOLIN HFA) 108 (90 BASE) MCG/ACT inhaler Inhale 2 puffs into the lungs every 6 (six) hours as needed. For wheezing or shortness of breath       . Alcohol Swabs 70 % PADS 1 application by Does not apply route daily.  100 each  0  . azithromycin (ZITHROMAX) 500 MG tablet Take 1 tablet (500 mg total) by mouth daily.  6 tablet  0  . cephALEXin (KEFLEX) 500 MG capsule Take 1 capsule (500 mg total) by mouth 4 (four) times daily.  40 capsule  0  . ciprofloxacin (CIPRO) 500 MG tablet Take 1 tablet (500 mg total) by mouth 2 (two) times daily.  14 tablet  0  . cyclobenzaprine (FLEXERIL) 10 MG tablet Take 10 mg by mouth 3 (three) times daily as needed. For muscle spasms        . DULoxetine (CYMBALTA) 60 MG capsule Take 60 mg by mouth daily.        Marland Kitchen EPINEPHrine (EPIPEN) 0.3 mg/0.3 mL DEVI Inject 0.3 mg into the muscle once.      . ferrous sulfate 325 (65 FE) MG tablet Take 325 mg by mouth daily.        Marland Kitchen gabapentin (NEURONTIN) 300 MG capsule Take 300 mg by mouth 1 day or 1 dose.      Marland Kitchen glucose blood (ACCU-CHEK ADVANTAGE TEST) test strip Use as instructed  100 each  12  . guaiFENesin-dextromethorphan (ROBITUSSIN DM) 100-10 MG/5ML syrup Take 5 mLs by mouth 3 (three) times daily as needed for cough.  118 mL  0  . HYDROcodone-acetaminophen (HYCET) 7.5-325 mg/15 ml solution Take 15 mLs by mouth 4 (four) times daily as needed for pain.  120 mL  0  . ibuprofen (ADVIL,MOTRIN) 800 MG tablet Take 1 tablet (800 mg total) by mouth 3 (three) times daily.  21 tablet  0  . Lancets (ACCU-CHEK MULTICLIX) lancets Use as instructed  100 each  12  . lansoprazole (PREVACID) 30 MG capsule Take 60 mg by mouth daily.        Marland Kitchen levothyroxine  (SYNTHROID, LEVOTHROID) 150 MCG tablet Take 1 tablet (150 mcg total) by mouth daily.  90 tablet  3  . metFORMIN (GLUCOPHAGE) 500 MG tablet Take 1 tablet (500 mg total) by mouth 2 (two) times daily with a meal.  60 tablet  3  . methocarbamol (ROBAXIN) 500 MG tablet Take 1 tablet (500 mg total) by mouth 2 (two) times daily.  20 tablet  0  . metoCLOPramide (REGLAN) 10 MG tablet Take 10 mg by mouth daily.        . metroNIDAZOLE (FLAGYL) 500 MG tablet Take 1 tablet (500 mg total) by mouth 2 (two) times daily. One po bid x 7 days  14 tablet  0  . omeprazole (PRILOSEC) 40 MG capsule Take 40 mg by mouth daily.      . phentermine (ADIPEX-P) 37.5 MG tablet Take 37.5 mg by mouth daily before breakfast.      . predniSONE (DELTASONE) 10 MG tablet Take 2 tablets (20 mg total) by mouth daily.  15 tablet  0  . probenecid (BENEMID) 500 MG tablet Take 500 mg by mouth 2 (two) times daily.        Marland Kitchen  promethazine (PHENERGAN) 25 MG tablet Take 1 tablet (25 mg total) by mouth every 6 (six) hours as needed for nausea.  12 tablet  0  . sulfamethoxazole-trimethoprim (SEPTRA DS) 800-160 MG per tablet Take 2 tablets by mouth 2 (two) times daily.  40 tablet  0   No current facility-administered medications for this visit.    Allergies  Allergen Reactions  . Almond Oil Other (See Comments)    Positive rxn to allergy test  . Apple Other (See Comments)    Positive rxn to allergy test  . Avocado Other (See Comments)    Positive rxn to allergy test  . Banana Other (See Comments)    Positive rxn to allergy test  . Cabbage Other (See Comments)    Positive rxn to allergy test  . Carrot [Daucus Carota] Other (See Comments)    Positive rxn to allergy test  . Eggs Or Egg-Derived Products Other (See Comments)    Positive rxn to allergy test  . Milk-Related Compounds Other (See Comments)    Positive rxn to allergy test  . Orange Fruit [Citrus] Other (See Comments)    Positive rxn to allergy test  . Peach [Prunus Persica]  Other (See Comments)    Positive rxn to allergy test  . Shellfish Allergy Other (See Comments)    Positive rxn to allergy test  . Strawberry Other (See Comments)    Positive rxn to allergy test  . Tomato Nausea And Vomiting and Other (See Comments)    Positive rxn to allergy test  . Yeast-Related Products Other (See Comments)    Positive rxn to allergy test  . Ultracet [Tramadol-Acetaminophen] Rash  . Ultram [Tramadol Hcl] Rash   Past Medical History  Diagnosis Date  . Thyroid disease   . Gout   . Arthritis   . Fibromyalgia   . MRSA (methicillin resistant Staphylococcus aureus)   . Diabetes mellitus without complication     No family history on file. History   Social History  . Marital Status: Divorced    Spouse Name: N/A    Number of Children: N/A  . Years of Education: N/A   Occupational History  . Not on file.   Social History Main Topics  . Smoking status: Former Games developer  . Smokeless tobacco: Not on file  . Alcohol Use: No  . Drug Use: No  . Sexual Activity: Not on file   Other Topics Concern  . Not on file   Social History Narrative  . No narrative on file    Review of Systems  Constitutional: Negative for fever, chills, diaphoresis, activity change, appetite change and fatigue.  HENT: Negative for ear pain, nosebleeds, congestion, facial swelling, rhinorrhea, neck pain, neck stiffness and ear discharge.  Eyes: Negative for pain, discharge, redness, itching and visual disturbance.  Respiratory: Negative for cough, choking, chest tightness, shortness of breath, wheezing and stridor.  Cardiovascular: Negative for chest pain, palpitations and leg swelling.  Gastrointestinal: Negative for abdominal distention, vomiting, diarrhea or consitpation Genitourinary: Negative for dysuria, urgency, frequency, hematuria, flank pain, decreased urine volume, difficulty urinating and dyspareunia.  Musculoskeletal: Negative for back pain, joint swelling, arthralgias or gait  problem.  Neurological: Negative for dizziness, tremors, seizures, syncope, facial asymmetry, speech difficulty, weakness, light-headedness, numbness and headaches.  Hematological: Negative for adenopathy. Does not bruise/bleed easily.  Psychiatric/Behavioral: Negative for hallucinations, behavioral problems, confusion, dysphoric mood   Objective:  There were no vitals taken for this visit. There were no vitals filed for this visit.  Physical Exam  Constitutional: Appears well-developed and well-nourished. No distress. HENT: Normocephalic. External right and left ear normal. Oropharynx is clear and moist.  Eyes: Conjunctivae and EOM are normal. PERRLA, no scleral icterus.  Neck: Normal ROM. Neck supple. No JVD. No tracheal deviation. No thyromegaly.  CVS: RRR, S1/S2 +, no murmurs, no gallops, no carotid bruit.  Pulmonary: Effort and breath sounds normal, no stridor, rhonchi, wheezes, rales.  Abdominal: Soft. BS +,  no distension, tenderness, rebound or guarding.  Musculoskeletal: Normal range of motion. No edema and no tenderness.  Neuro: Alert. Normal reflexes, muscle tone coordination. No cranial nerve deficit. Skin: Skin is warm and dry. No rash noted. Not diaphoretic. No erythema. No pallor.  Psychiatric: Normal mood and affect. Behavior, judgment, thought content normal.   Lab Results  Component Value Date   WBC 7.9 10/22/2012   HGB 10.4* 10/22/2012   HCT 31.8* 10/22/2012   MCV 85.3 10/22/2012   PLT 288 10/22/2012   Lab Results  Component Value Date   CREATININE 0.98 10/22/2012   BUN 16 10/22/2012   NA 142 10/22/2012   K 3.6 10/22/2012   CL 103 10/22/2012   CO2 26 10/22/2012    Lab Results  Component Value Date   HGBA1C 6.5* 10/22/2012   Lipid Panel     Component Value Date/Time   CHOL 156 10/22/2012 1858   TRIG 125 10/22/2012 1858   HDL 50 10/22/2012 1858   CHOLHDL 3.1 10/22/2012 1858   VLDL 25 10/22/2012 1858   LDLCALC 81 10/22/2012 1858        Patient Active Problem  List   Diagnosis Date Noted  . Unspecified hypothyroidism 10/22/2012  . Essential hypertension, benign 10/22/2012  . Arthritis 10/22/2012  . CKD (chronic kidney disease) 10/22/2012  . Acute upper respiratory infections of unspecified site 10/22/2012     Preventative Medicine:  Health Maintenance  Topic Date Due  . Pap Smear  02/20/1982  . Tetanus/tdap  02/21/1983  . Influenza Vaccine  08/30/2012    Adult vaccines due  Topic Date Due  . Tetanus/tdap  02/21/1983   Mammogram: Colonoscopy : Pap Smear : Flu vaccine:   LAB WORK:  Metabolic panel: CBC:  Vitamin D : Lipid Panel: TSH: PSA:    Assessment and plan: No diagnosis found.   No Follow-up on file.   Calvert CantorSaima Westlyn Glaza, MD

## 2013-03-18 ENCOUNTER — Ambulatory Visit: Payer: Medicaid Other

## 2013-03-28 ENCOUNTER — Ambulatory Visit: Payer: Medicaid Other | Attending: Internal Medicine | Admitting: Internal Medicine

## 2013-03-28 ENCOUNTER — Encounter: Payer: Self-pay | Admitting: Internal Medicine

## 2013-03-28 VITALS — BP 121/75 | HR 79 | Temp 98.6°F | Resp 16 | Wt 240.8 lb

## 2013-03-28 DIAGNOSIS — E039 Hypothyroidism, unspecified: Secondary | ICD-10-CM

## 2013-03-28 DIAGNOSIS — Z87891 Personal history of nicotine dependence: Secondary | ICD-10-CM | POA: Insufficient documentation

## 2013-03-28 DIAGNOSIS — R0981 Nasal congestion: Secondary | ICD-10-CM

## 2013-03-28 DIAGNOSIS — I10 Essential (primary) hypertension: Secondary | ICD-10-CM

## 2013-03-28 DIAGNOSIS — E119 Type 2 diabetes mellitus without complications: Secondary | ICD-10-CM | POA: Insufficient documentation

## 2013-03-28 DIAGNOSIS — Z8614 Personal history of Methicillin resistant Staphylococcus aureus infection: Secondary | ICD-10-CM | POA: Insufficient documentation

## 2013-03-28 DIAGNOSIS — J3489 Other specified disorders of nose and nasal sinuses: Secondary | ICD-10-CM

## 2013-03-28 DIAGNOSIS — IMO0001 Reserved for inherently not codable concepts without codable children: Secondary | ICD-10-CM | POA: Insufficient documentation

## 2013-03-28 DIAGNOSIS — Z09 Encounter for follow-up examination after completed treatment for conditions other than malignant neoplasm: Secondary | ICD-10-CM | POA: Insufficient documentation

## 2013-03-28 LAB — TSH: TSH: 21.609 u[IU]/mL — ABNORMAL HIGH (ref 0.350–4.500)

## 2013-03-28 LAB — POCT GLYCOSYLATED HEMOGLOBIN (HGB A1C): HEMOGLOBIN A1C: 5.7

## 2013-03-28 LAB — GLUCOSE, POCT (MANUAL RESULT ENTRY): POC GLUCOSE: 126 mg/dL — AB (ref 70–99)

## 2013-03-28 MED ORDER — FLUTICASONE PROPIONATE 50 MCG/ACT NA SUSP: 2.0000 | Freq: Every day | NASAL | Status: AC

## 2013-03-28 NOTE — Progress Notes (Signed)
MRN: [id number] Name: Nakema Fake  Sex: female Age: 48 y.o. DOB: [birth year]  Allergies: Almond oil; Apple; Avocado; Banana; Cabbage; Carrot; Eggs or egg-derived products; Milk-related compounds; Orange fruit; Peach; Shellfish allergy; Strawberry; Tomato; Yeast-related products; Ultracet; and Ultram  Chief Complaint  Patient presents with  . Follow-up    HPI: Patient is 48 y.o. female who has to of diabetes, hypothyroidism, comes today for followup, she denies any hypoglycemic symptoms currently taking metformin 500 mg twice a day, she is taking levothyroxine 150 mcg daily, last TSH level was in September, recently patient reported to have lot of nasal congestion and noticed some blood as well denies any fever chills, denies any chest and shortness of breath or any cough.  Past Medical History  Diagnosis Date  . Thyroid disease   . Gout   . Arthritis   . Fibromyalgia   . MRSA (methicillin resistant Staphylococcus aureus)   . Diabetes mellitus without complication     Past Surgical History  Procedure Laterality Date  . Appendectomy    . Cholecystectomy    . Tonsillectomy        Medication List       This list is accurate as of: 03/28/13 11:04 AM.  Always use your most recent med list.               accu-chek multiclix lancets  Use as instructed     acyclovir 400 MG tablet  Commonly known as:  ZOVIRAX  Take 1 tablet (400 mg total) by mouth 3 (three) times daily.     albuterol 108 (90 BASE) MCG/ACT inhaler  Commonly known as:  PROVENTIL HFA;VENTOLIN HFA  Inhale 2 puffs into the lungs every 6 (six) hours as needed. For wheezing or shortness of breath     Alcohol Swabs 70 % Pads  1 application by Does not apply route daily.     azithromycin 500 MG tablet  Commonly known as:  ZITHROMAX  Take 1 tablet (500 mg total) by mouth daily.     cephALEXin 500 MG capsule  Commonly known as:  KEFLEX  Take 1 capsule (500 mg total) by mouth 4 (four) times daily.     ciprofloxacin 500 MG tablet  Commonly known as:  CIPRO  Take 1 tablet (500 mg total) by mouth 2 (two) times daily.     cyclobenzaprine 10 MG tablet  Commonly known as:  FLEXERIL  - Take 10 mg by mouth 3 (three) times daily as needed. For muscle spasms  -      DULoxetine 60 MG capsule  Commonly known as:  CYMBALTA  Take 60 mg by mouth daily.     EPIPEN 0.3 mg/0.3 mL Devi  Generic drug:  EPINEPHrine  Inject 0.3 mg into the muscle once.     ferrous sulfate 325 (65 FE) MG tablet  Take 325 mg by mouth daily.     fluticasone 50 MCG/ACT nasal spray  Commonly known as:  FLONASE  Place 2 sprays into both nostrils daily.     gabapentin 300 MG capsule  Commonly known as:  NEURONTIN  Take 300 mg by mouth 1 day or 1 dose.     glucose blood test strip  Commonly known as:  ACCU-CHEK ADVANTAGE TEST  Use as instructed     guaiFENesin-dextromethorphan 100-10 MG/5ML syrup  Commonly known as:  ROBITUSSIN DM  Take 5 mLs by mouth 3 (three) times daily as needed for cough.     HYDROcodone-acetaminophen 7.5-325 mg/15 ml  solution  Commonly known as:  HYCET  Take 15 mLs by mouth 4 (four) times daily as needed for pain.     ibuprofen 800 MG tablet  Commonly known as:  ADVIL,MOTRIN  Take 1 tablet (800 mg total) by mouth 3 (three) times daily.     lansoprazole 30 MG capsule  Commonly known as:  PREVACID  Take 60 mg by mouth daily.     levothyroxine 150 MCG tablet  Commonly known as:  SYNTHROID, LEVOTHROID  Take 1 tablet (150 mcg total) by mouth daily.     metFORMIN 500 MG tablet  Commonly known as:  GLUCOPHAGE  Take 1 tablet (500 mg total) by mouth 2 (two) times daily with a meal.     methocarbamol 500 MG tablet  Commonly known as:  ROBAXIN  Take 1 tablet (500 mg total) by mouth 2 (two) times daily.     metoCLOPramide 10 MG tablet  Commonly known as:  REGLAN  Take 10 mg by mouth daily.     metroNIDAZOLE 500 MG tablet  Commonly known as:  FLAGYL  Take 1 tablet (500 mg total) by  mouth 2 (two) times daily. One po bid x 7 days     omeprazole 40 MG capsule  Commonly known as:  PRILOSEC  Take 40 mg by mouth daily.     phentermine 37.5 MG tablet  Commonly known as:  ADIPEX-P  Take 37.5 mg by mouth daily before breakfast.     predniSONE 10 MG tablet  Commonly known as:  DELTASONE  Take 2 tablets (20 mg total) by mouth daily.     probenecid 500 MG tablet  Commonly known as:  BENEMID  Take 500 mg by mouth 2 (two) times daily.     promethazine 25 MG tablet  Commonly known as:  PHENERGAN  Take 1 tablet (25 mg total) by mouth every 6 (six) hours as needed for nausea.     sulfamethoxazole-trimethoprim 800-160 MG per tablet  Commonly known as:  SEPTRA DS  Take 2 tablets by mouth 2 (two) times daily.        Meds ordered this encounter  Medications  . fluticasone (FLONASE) 50 MCG/ACT nasal spray    Sig: Place 2 sprays into both nostrils daily.    Dispense:  16 g    Refill:  3     There is no immunization history on file for this patient.  History reviewed. No pertinent family history.  History  Substance Use Topics  . Smoking status: Former Games developer  . Smokeless tobacco: Not on file  . Alcohol Use: No    Review of Systems   As noted in HPI  Filed Vitals:   03/28/13 1053  BP: 121/75  Pulse: 79  Temp: 98.6 F (37 C)  Resp: 16    Physical Exam  Physical Exam  Constitutional: No distress.  HENT:  Nasal congestion ? Polyp, no sinus tenderness   Eyes: EOM are normal. Pupils are equal, round, and reactive to light.  Cardiovascular: Normal rate and regular rhythm.   Pulmonary/Chest: Breath sounds normal. No respiratory distress. She has no wheezes. She has no rales.    CBC    Component Value Date/Time   WBC 7.9 10/22/2012 1858   RBC 3.73* 10/22/2012 1858   HGB 10.4* 10/22/2012 1858   HCT 31.8* 10/22/2012 1858   PLT 288 10/22/2012 1858   MCV 85.3 10/22/2012 1858   LYMPHSABS 4.0 10/22/2012 1858   MONOABS 0.5 10/22/2012 1858   EOSABS  0.1  10/22/2012 1858   BASOSABS 0.0 10/22/2012 1858    CMP     Component Value Date/Time   NA 142 10/22/2012 1858   K 3.6 10/22/2012 1858   CL 103 10/22/2012 1858   CO2 26 10/22/2012 1858   GLUCOSE 83 10/22/2012 1858   BUN 16 10/22/2012 1858   CREATININE 0.98 10/22/2012 1858   CREATININE 1.20* 09/27/2012 2222   CALCIUM 10.6* 10/22/2012 1858   PROT 8.2 10/22/2012 1858   ALBUMIN 5.0 10/22/2012 1858   AST 26 10/22/2012 1858   ALT 24 10/22/2012 1858   ALKPHOS 70 10/22/2012 1858   BILITOT 0.3 10/22/2012 1858   GFRNONAA 53* 09/27/2012 2222   GFRAA 61* 09/27/2012 2222    Lab Results  Component Value Date/Time   CHOL 156 10/22/2012  6:58 PM    No components found with this basename: hga1c    Lab Results  Component Value Date/Time   AST 26 10/22/2012  6:58 PM    Assessment and Plan  DM (diabetes mellitus) - Plan: Glucose (CBG), HgB A1c Results for orders placed in visit on 03/28/13  GLUCOSE, POCT (MANUAL RESULT ENTRY)      Result Value Ref Range   POC Glucose 126 (*) 70 - 99 mg/dl  POCT GLYCOSYLATED HEMOGLOBIN (HGB A1C)      Result Value Ref Range   Hemoglobin A1C 5.7     Diabetes is well controlled we'll continue with current medications.  Unspecified hypothyroidism -Plan: Continue with her levothyroxine 150 mcg daily, will repeat TSH level  Essential hypertension, benign Well controlled continue with medications and diet modification. Her  Stuffy nose - Plan: We'll try fluticasone (FLONASE) 50 MCG/ACT nasal spray, also advise patient not to prick the  nose    Return in about 3 months (around 06/25/2013) for hypothyroid, diabetes.  Doris CheadleADVANI, Lynk Marti, MD

## 2013-03-28 NOTE — Progress Notes (Signed)
Patient here for follow up-DM Has been complaining of headaches and recently Some nose bleeds

## 2013-03-31 ENCOUNTER — Telehealth: Payer: Self-pay

## 2013-03-31 NOTE — Telephone Encounter (Signed)
Patient is aware of her lab results Medication called into [facility]

## 2013-04-08 ENCOUNTER — Ambulatory Visit: Payer: Medicaid Other

## 2013-07-09 IMAGING — CR DG CERVICAL SPINE COMPLETE 4+V
5 series · 5 of 5 positions shown · non-contrast
Comparison: Cervical MRI 07/25/2007.

CLINICAL DATA: 46-year-old female with syncope.  Pain.

CERVICAL SPINE - COMPLETE 4+ VIEW

[w c-spine lat *]
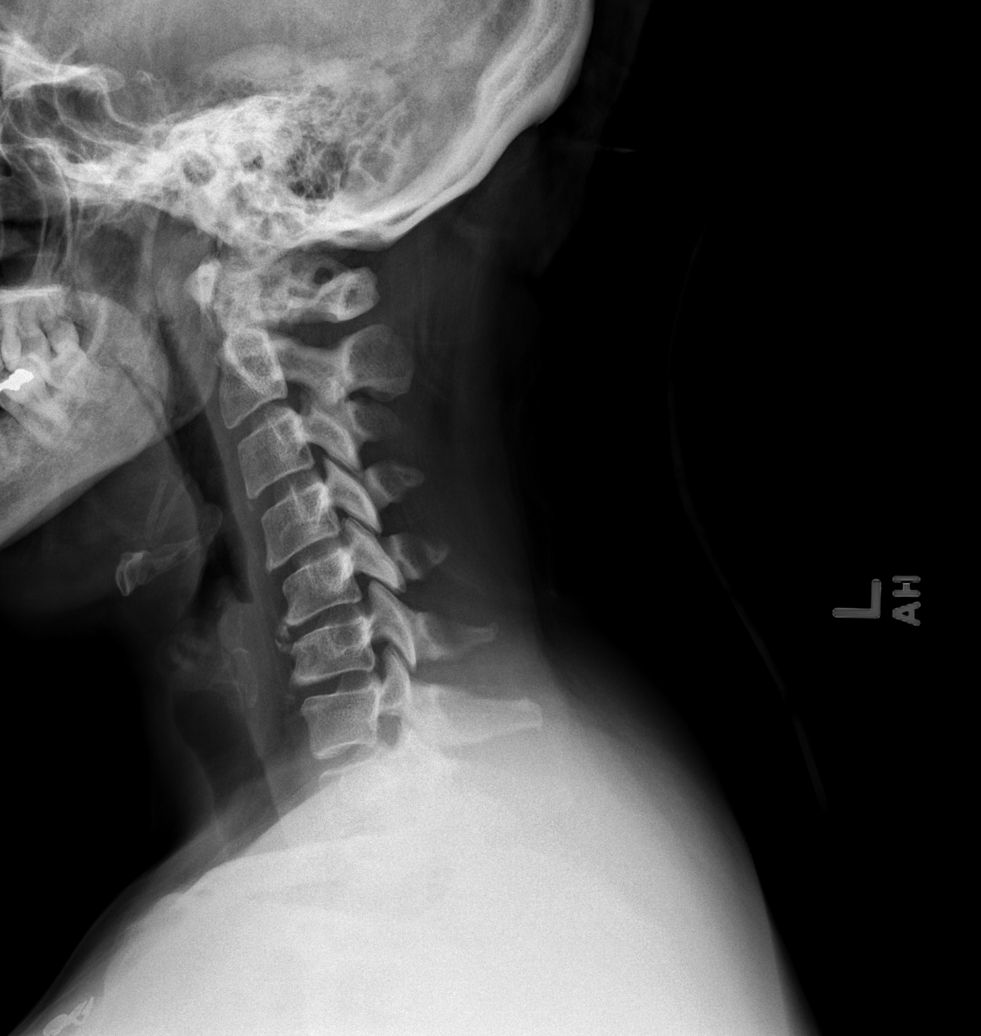

[w c-spine oblique * (1 of 2)]
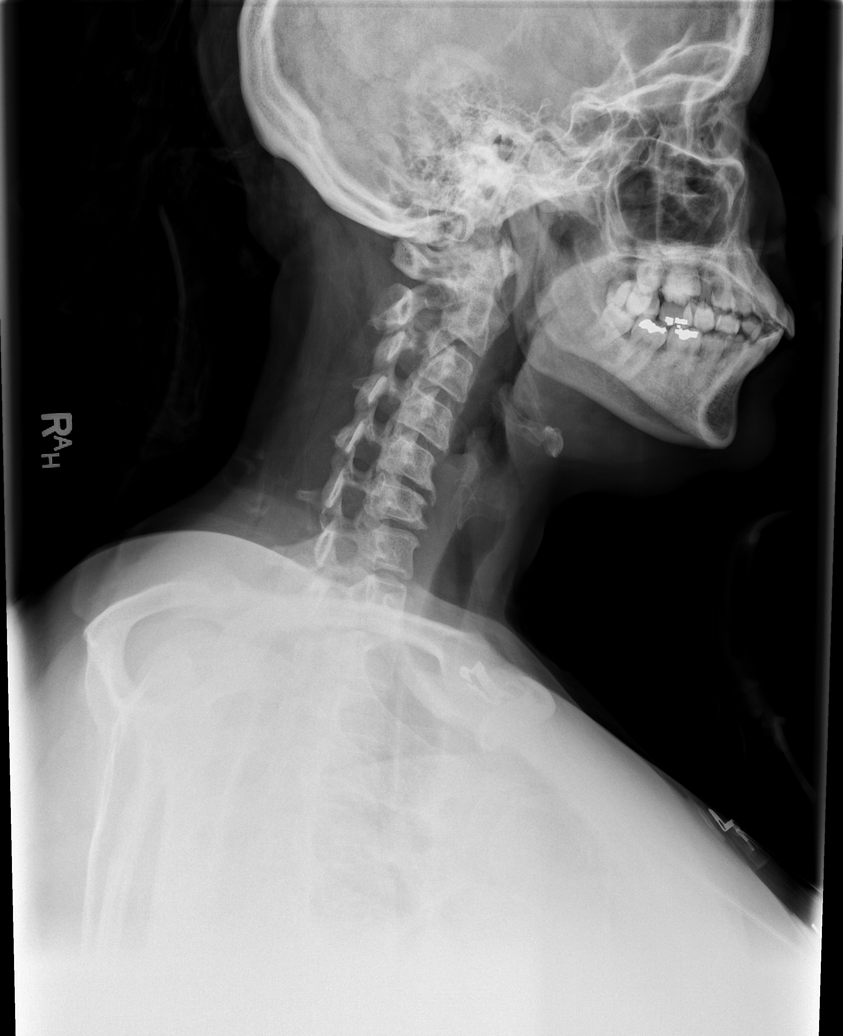

[w c-spine oblique * (2 of 2)]
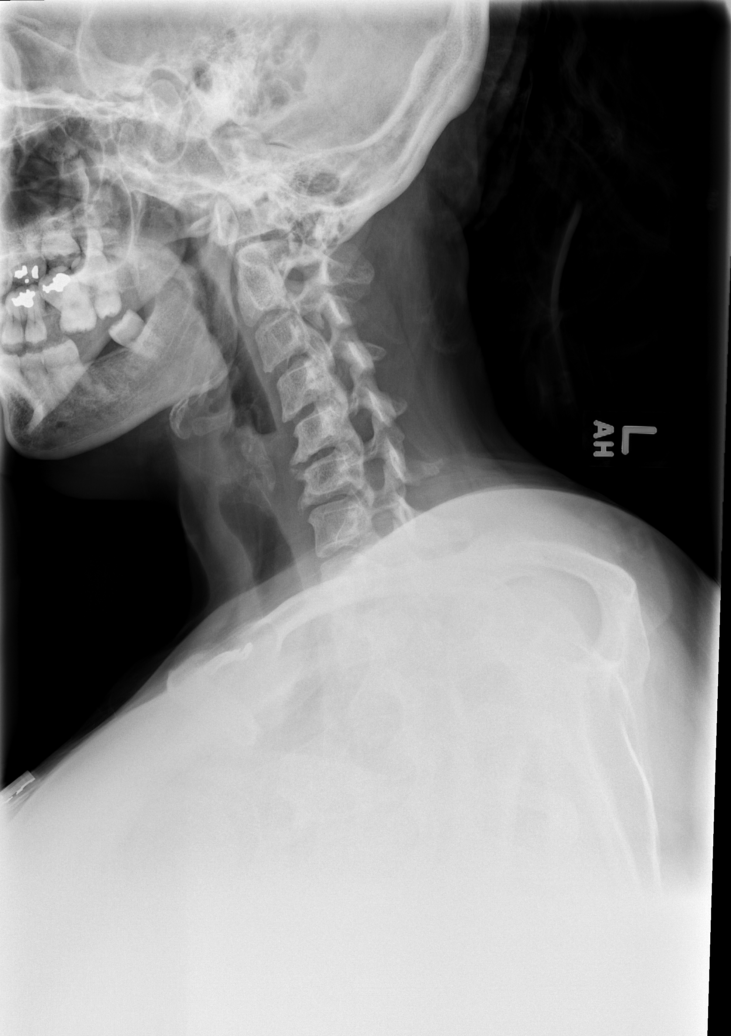

[w c-spine a.p.]
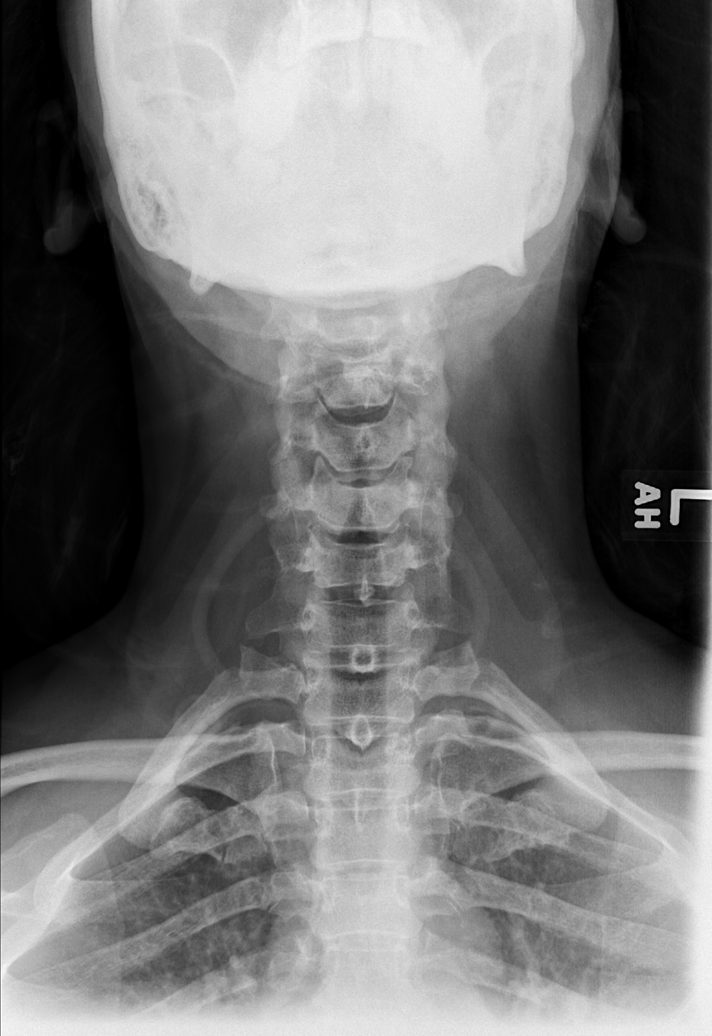

[w c-spine odontoid]
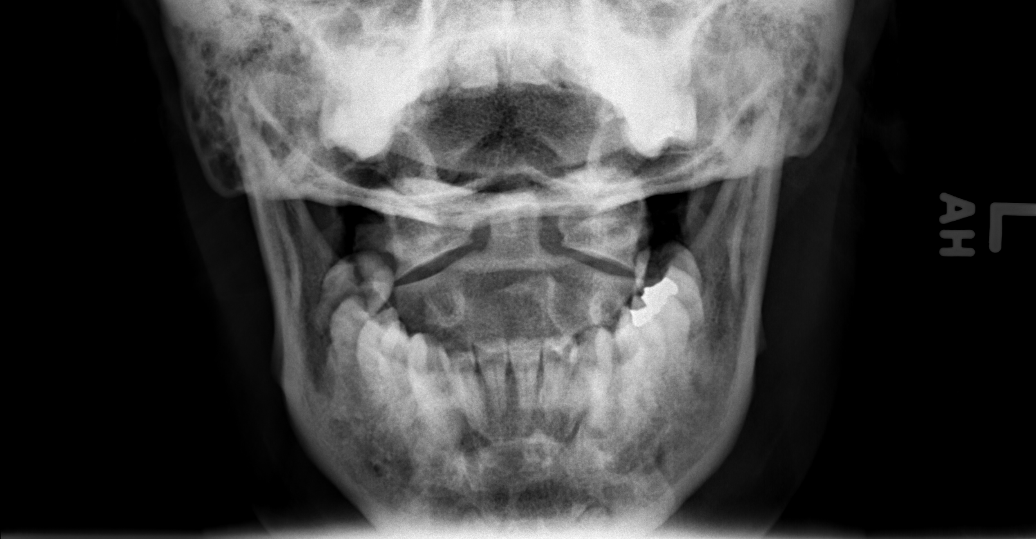

[5 of 5 positions shown; findings below may reference images not displayed]

FINDINGS: Stable reversal of cervical lordosis.  Prevertebral soft
tissue contours remain within normal limits. Cervicothoracic
junction alignment is within normal limits.  Stable disc spaces.
Bilateral posterior element alignment is within normal limits.  AP
alignment and lung apices within normal limits.  C1-C2 alignment
and odontoid within normal limits.
IMPRESSION: Stable. No acute fracture or listhesis identified in the cervical
spine.  Ligamentous injury is not excluded.

## 2013-09-16 ENCOUNTER — Emergency Department (HOSPITAL_BASED_OUTPATIENT_CLINIC_OR_DEPARTMENT_OTHER): Payer: Self-pay

## 2013-09-16 ENCOUNTER — Emergency Department (HOSPITAL_BASED_OUTPATIENT_CLINIC_OR_DEPARTMENT_OTHER)
Admission: EM | Admit: 2013-09-16 | Discharge: 2013-09-17 | Disposition: A | Discharge status: Home / Self Care | Payer: Self-pay | Attending: Emergency Medicine | Admitting: Emergency Medicine

## 2013-09-16 ENCOUNTER — Encounter (HOSPITAL_BASED_OUTPATIENT_CLINIC_OR_DEPARTMENT_OTHER): Payer: Self-pay | Admitting: Emergency Medicine

## 2013-09-16 DIAGNOSIS — W1809XA Striking against other object with subsequent fall, initial encounter: Secondary | ICD-10-CM | POA: Insufficient documentation

## 2013-09-16 DIAGNOSIS — S8990XA Unspecified injury of unspecified lower leg, initial encounter: Secondary | ICD-10-CM | POA: Insufficient documentation

## 2013-09-16 DIAGNOSIS — S92911A Unspecified fracture of right toe(s), initial encounter for closed fracture: Secondary | ICD-10-CM

## 2013-09-16 DIAGNOSIS — S199XXA Unspecified injury of neck, initial encounter: Secondary | ICD-10-CM

## 2013-09-16 DIAGNOSIS — S99919A Unspecified injury of unspecified ankle, initial encounter: Secondary | ICD-10-CM

## 2013-09-16 DIAGNOSIS — M109 Gout, unspecified: Secondary | ICD-10-CM | POA: Insufficient documentation

## 2013-09-16 DIAGNOSIS — Z87891 Personal history of nicotine dependence: Secondary | ICD-10-CM | POA: Insufficient documentation

## 2013-09-16 DIAGNOSIS — Z8614 Personal history of Methicillin resistant Staphylococcus aureus infection: Secondary | ICD-10-CM | POA: Insufficient documentation

## 2013-09-16 DIAGNOSIS — S92919A Unspecified fracture of unspecified toe(s), initial encounter for closed fracture: Secondary | ICD-10-CM | POA: Insufficient documentation

## 2013-09-16 DIAGNOSIS — Z792 Long term (current) use of antibiotics: Secondary | ICD-10-CM | POA: Insufficient documentation

## 2013-09-16 DIAGNOSIS — Y9389 Activity, other specified: Secondary | ICD-10-CM | POA: Insufficient documentation

## 2013-09-16 DIAGNOSIS — E039 Hypothyroidism, unspecified: Secondary | ICD-10-CM | POA: Insufficient documentation

## 2013-09-16 DIAGNOSIS — S0993XA Unspecified injury of face, initial encounter: Secondary | ICD-10-CM | POA: Insufficient documentation

## 2013-09-16 DIAGNOSIS — Y929 Unspecified place or not applicable: Secondary | ICD-10-CM | POA: Insufficient documentation

## 2013-09-16 DIAGNOSIS — S99929A Unspecified injury of unspecified foot, initial encounter: Secondary | ICD-10-CM

## 2013-09-16 DIAGNOSIS — Z79899 Other long term (current) drug therapy: Secondary | ICD-10-CM | POA: Insufficient documentation

## 2013-09-16 DIAGNOSIS — IMO0002 Reserved for concepts with insufficient information to code with codable children: Secondary | ICD-10-CM | POA: Insufficient documentation

## 2013-09-16 DIAGNOSIS — Z8739 Personal history of other diseases of the musculoskeletal system and connective tissue: Secondary | ICD-10-CM | POA: Insufficient documentation

## 2013-09-16 DIAGNOSIS — E119 Type 2 diabetes mellitus without complications: Secondary | ICD-10-CM | POA: Insufficient documentation

## 2013-09-16 MED ORDER — NAPROXEN 250 MG PO TABS
500.0000 mg | ORAL_TABLET | Freq: Once | ORAL | Status: AC
  Administered 2013-09-16: 500 mg via ORAL
  Filled 2013-09-16: qty 2

## 2013-09-16 NOTE — ED Provider Notes (Signed)
CSN: [id number]     Arrival date & time 09/16/13  2002 History  This chart was scribed for Chauncey Sciulli Smitty Cords, MD by Milly Jakob, ED Scribe. The patient was seen in room MH11/MH11. Patient's care was started at 11:15 PM.     Chief Complaint  Patient presents with  . Fall   Patient is a 48 y.o. female presenting with fall. The history is provided by the patient. No language interpreter was used.  Fall This is a new problem. The current episode started yesterday. The problem occurs constantly. The problem has not changed since onset.Pertinent negatives include no chest pain, no abdominal pain, no headaches and no shortness of breath. Nothing aggravates the symptoms. Nothing relieves the symptoms. She has tried nothing for the symptoms. The treatment provided no relief.   HPI Comments: Kristine Ramos is a 48 y.o. female who presents to the Emergency Department after falling down her steps 24 hours ago. She reports that she was using a walker, and when she fell foward she hit her knees on the walker and she landed predominately on her right foot. She reports constant throbbing pain in her right foot and neck. She reports walking with difficulty. She reports a history of hypothyroidism, and states that she was instructed not to take NSAIDs.   Past Medical History  Diagnosis Date  . Thyroid disease   . Gout   . Arthritis   . Fibromyalgia   . MRSA (methicillin resistant Staphylococcus aureus)   . Diabetes mellitus without complication    Past Surgical History  Procedure Laterality Date  . Appendectomy    . Cholecystectomy    . Tonsillectomy     No family history on file. History  Substance Use Topics  . Smoking status: Former Games developer  . Smokeless tobacco: Not on file  . Alcohol Use: No   OB History   Grav Para Term Preterm Abortions TAB SAB Ect Mult Living                 Review of Systems  Respiratory: Negative for shortness of breath.   Cardiovascular: Negative for chest  pain.  Gastrointestinal: Negative for abdominal pain.  Musculoskeletal: Positive for arthralgias.  Neurological: Negative for headaches.  All other systems reviewed and are negative.  Allergies  Almond oil; Apple; Avocado; Banana; Cabbage; Carrot; Eggs or egg-derived products; Milk-related compounds; Orange fruit; Peach; Shellfish allergy; Strawberry; Tomato; Yeast-related products; Ultracet; and Ultram  Home Medications   Prior to Admission medications   Medication Sig Start Date End Date Taking? Authorizing Provider  acyclovir (ZOVIRAX) 400 MG tablet Take 1 tablet (400 mg total) by mouth 3 (three) times daily. 09/27/12   Rolan Bucco, MD  albuterol (PROVENTIL HFA;VENTOLIN HFA) 108 (90 BASE) MCG/ACT inhaler Inhale 2 puffs into the lungs every 6 (six) hours as needed. For wheezing or shortness of breath     Historical Provider, MD  Alcohol Swabs 70 % PADS 1 application by Does not apply route daily. 11/15/12   Nishant Dhungel, MD  azithromycin (ZITHROMAX) 500 MG tablet Take 1 tablet (500 mg total) by mouth daily. 10/22/12   Jeanann Lewandowsky, MD  cephALEXin (KEFLEX) 500 MG capsule Take 1 capsule (500 mg total) by mouth 4 (four) times daily. 10/24/11   Derwood Kaplan, MD  ciprofloxacin (CIPRO) 500 MG tablet Take 1 tablet (500 mg total) by mouth 2 (two) times daily. 09/22/12   Kaitlyn Szekalski, PA-C  cyclobenzaprine (FLEXERIL) 10 MG tablet Take 10 mg by mouth 3 (three)  times daily as needed. For muscle spasms      Historical Provider, MD  DULoxetine (CYMBALTA) 60 MG capsule Take 60 mg by mouth daily.      Historical Provider, MD  EPINEPHrine (EPIPEN) 0.3 mg/0.3 mL DEVI Inject 0.3 mg into the muscle once.    Historical Provider, MD  ferrous sulfate 325 (65 FE) MG tablet Take 325 mg by mouth daily.      Historical Provider, MD  fluticasone (FLONASE) 50 MCG/ACT nasal spray Place 2 sprays into both nostrils daily. 03/28/13   Doris Cheadle, MD  gabapentin (NEURONTIN) 300 MG capsule Take 300 mg by  mouth 1 day or 1 dose.    Historical Provider, MD  glucose blood (ACCU-CHEK ADVANTAGE TEST) test strip Use as instructed 11/15/12   Nishant Dhungel, MD  guaiFENesin-dextromethorphan (ROBITUSSIN DM) 100-10 MG/5ML syrup Take 5 mLs by mouth 3 (three) times daily as needed for cough. 10/22/12   Jeanann Lewandowsky, MD  HYDROcodone-acetaminophen (HYCET) 7.5-325 mg/15 ml solution Take 15 mLs by mouth 4 (four) times daily as needed for pain. 09/22/12 09/22/13  Emilia Beck, PA-C  ibuprofen (ADVIL,MOTRIN) 800 MG tablet Take 1 tablet (800 mg total) by mouth 3 (three) times daily. 07/16/12   Elson Areas, PA-C  Lancets (ACCU-CHEK MULTICLIX) lancets Use as instructed 11/15/12   Nishant Dhungel, MD  lansoprazole (PREVACID) 30 MG capsule Take 60 mg by mouth daily.      Historical Provider, MD  levothyroxine (SYNTHROID, LEVOTHROID) 150 MCG tablet Take 1 tablet (150 mcg total) by mouth daily. 11/15/12   Nishant Dhungel, MD  metFORMIN (GLUCOPHAGE) 500 MG tablet Take 1 tablet (500 mg total) by mouth 2 (two) times daily with a meal. 11/15/12   Nishant Dhungel, MD  methocarbamol (ROBAXIN) 500 MG tablet Take 1 tablet (500 mg total) by mouth 2 (two) times daily. 07/16/12   Elson Areas, PA-C  metoCLOPramide (REGLAN) 10 MG tablet Take 10 mg by mouth daily.      Historical Provider, MD  metroNIDAZOLE (FLAGYL) 500 MG tablet Take 1 tablet (500 mg total) by mouth 2 (two) times daily. One po bid x 7 days 09/27/12   Rolan Bucco, MD  omeprazole (PRILOSEC) 40 MG capsule Take 40 mg by mouth daily.    Historical Provider, MD  phentermine (ADIPEX-P) 37.5 MG tablet Take 37.5 mg by mouth daily before breakfast.    Historical Provider, MD  predniSONE (DELTASONE) 10 MG tablet Take 2 tablets (20 mg total) by mouth daily. 06/12/12   Hilario Quarry, MD  probenecid (BENEMID) 500 MG tablet Take 500 mg by mouth 2 (two) times daily.      Historical Provider, MD  promethazine (PHENERGAN) 25 MG tablet Take 1 tablet (25 mg total) by mouth every  6 (six) hours as needed for nausea. 09/22/12   Kaitlyn Szekalski, PA-C  sulfamethoxazole-trimethoprim (SEPTRA DS) 800-160 MG per tablet Take 2 tablets by mouth 2 (two) times daily. 10/24/11   Derwood Kaplan, MD   Triage Vitals: BP 121/63  Pulse 72  Temp(Src) 99.4 F (37.4 C) (Oral)  Resp 18  Ht 5\' 6"  (1.676 m)  Wt 240 lb (108.863 kg)  BMI 38.76 kg/m2  SpO2 100% Physical Exam  Nursing note and vitals reviewed. Constitutional: She is oriented to person, place, and time. She appears well-developed and well-nourished. No distress.  HENT:  Head: Normocephalic and atraumatic. Head is without raccoon's eyes and without Battle's sign.  Right Ear: Tympanic membrane normal. No mastoid tenderness. No hemotympanum.  Left Ear: Tympanic  membrane normal. No mastoid tenderness. No hemotympanum.  Mouth/Throat: Oropharynx is clear and moist.  Eyes: Conjunctivae and EOM are normal. Pupils are equal, round, and reactive to light.  Neck: Normal range of motion. Neck supple. No tracheal deviation present.  Muscle spasm in the neck.   Cardiovascular: Normal rate, regular rhythm and intact distal pulses.   Pulmonary/Chest: Effort normal and breath sounds normal. No respiratory distress. She has no wheezes. She has no rales.  Abdominal: Soft. Bowel sounds are normal. There is no tenderness. There is no rebound and no guarding.  Pelvis stable  Musculoskeletal: Normal range of motion. She exhibits no edema.       Right shoulder: Normal.       Left shoulder: Normal.       Right knee: Normal. She exhibits normal range of motion, no swelling, no effusion, no ecchymosis, no deformity, no laceration, no erythema, normal alignment, no LCL laxity, normal patellar mobility, no bony tenderness, normal meniscus and no MCL laxity. No tenderness found. No medial joint line, no lateral joint line, no MCL, no LCL and no patellar tendon tenderness noted.       Left knee: She exhibits normal range of motion, no swelling, no  effusion, no ecchymosis, no deformity, no laceration, no erythema, normal alignment, no LCL laxity, normal patellar mobility, no bony tenderness, normal meniscus and no MCL laxity. No tenderness found. No medial joint line, no lateral joint line, no MCL, no LCL and no patellar tendon tenderness noted.       Right foot: She exhibits normal range of motion, no swelling, normal capillary refill, no crepitus, no deformity and no laceration.       Left foot: She exhibits normal range of motion, no swelling, normal capillary refill, no crepitus and no deformity.  Bilaterally feet neurovacularley intact. No deformity. CR < 2 secs. Intact Dorsalis pedis bilaterally. Intact achilles tendons bilaterally.   Neurological: She is alert and oriented to person, place, and time.  Skin: Skin is warm and dry.  Psychiatric: She has a normal mood and affect. Her behavior is normal.    ED Course  Procedures (including critical care time)  11:21 PM-Discussed treatment plan with pt at bedside and pt agreed to plan.   Labs Review Labs Reviewed - No data to display  Imaging Review No results found.   EKG Interpretation None      MDM   Final diagnoses:  None   Toes fractures.  Post op shoe, ice elevation and pain medication  I personally performed the services described in this documentation, which was scribed in my presence. The recorded information has been reviewed and is accurate.     Ilisha Blust Smitty CordsK Michalla Ringer-Rasch, MD 09/17/13 0100

## 2013-09-16 NOTE — ED Notes (Signed)
Pt fell down steps yesterday  C/o bilateral knee pain  Left worse than rt, rt foot pain and neck pain

## 2013-09-16 NOTE — ED Notes (Signed)
Pt reports she missed two steps and fell down onto her right side yesterday. She c/o pain neck, right great toe, and bottom of right foot.

## 2013-09-17 ENCOUNTER — Encounter (HOSPITAL_BASED_OUTPATIENT_CLINIC_OR_DEPARTMENT_OTHER): Payer: Self-pay | Admitting: Emergency Medicine

## 2013-09-17 MED ORDER — HYDROCODONE-ACETAMINOPHEN 5-325 MG PO TABS: 1.0000 | ORAL_TABLET | Freq: Four times a day (QID) | ORAL | Status: AC | PRN

## 2013-10-06 ENCOUNTER — Emergency Department (HOSPITAL_BASED_OUTPATIENT_CLINIC_OR_DEPARTMENT_OTHER)
Admission: EM | Admit: 2013-10-06 | Discharge: 2013-10-06 | Disposition: A | Discharge status: Home / Self Care | Payer: No Typology Code available for payment source | Attending: Emergency Medicine | Admitting: Emergency Medicine

## 2013-10-06 ENCOUNTER — Encounter (HOSPITAL_BASED_OUTPATIENT_CLINIC_OR_DEPARTMENT_OTHER): Payer: Self-pay | Admitting: Emergency Medicine

## 2013-10-06 DIAGNOSIS — Z8614 Personal history of Methicillin resistant Staphylococcus aureus infection: Secondary | ICD-10-CM | POA: Insufficient documentation

## 2013-10-06 DIAGNOSIS — S91109A Unspecified open wound of unspecified toe(s) without damage to nail, initial encounter: Secondary | ICD-10-CM | POA: Insufficient documentation

## 2013-10-06 DIAGNOSIS — S91209A Unspecified open wound of unspecified toe(s) with damage to nail, initial encounter: Secondary | ICD-10-CM

## 2013-10-06 DIAGNOSIS — M129 Arthropathy, unspecified: Secondary | ICD-10-CM | POA: Insufficient documentation

## 2013-10-06 DIAGNOSIS — M109 Gout, unspecified: Secondary | ICD-10-CM | POA: Insufficient documentation

## 2013-10-06 DIAGNOSIS — S99929A Unspecified injury of unspecified foot, initial encounter: Secondary | ICD-10-CM

## 2013-10-06 DIAGNOSIS — S8990XA Unspecified injury of unspecified lower leg, initial encounter: Secondary | ICD-10-CM | POA: Insufficient documentation

## 2013-10-06 DIAGNOSIS — Y929 Unspecified place or not applicable: Secondary | ICD-10-CM | POA: Insufficient documentation

## 2013-10-06 DIAGNOSIS — E079 Disorder of thyroid, unspecified: Secondary | ICD-10-CM | POA: Insufficient documentation

## 2013-10-06 DIAGNOSIS — Y9389 Activity, other specified: Secondary | ICD-10-CM | POA: Insufficient documentation

## 2013-10-06 DIAGNOSIS — E119 Type 2 diabetes mellitus without complications: Secondary | ICD-10-CM | POA: Insufficient documentation

## 2013-10-06 DIAGNOSIS — S99919A Unspecified injury of unspecified ankle, initial encounter: Secondary | ICD-10-CM

## 2013-10-06 DIAGNOSIS — IMO0002 Reserved for concepts with insufficient information to code with codable children: Secondary | ICD-10-CM | POA: Insufficient documentation

## 2013-10-06 DIAGNOSIS — M5030 Other cervical disc degeneration, unspecified cervical region: Secondary | ICD-10-CM | POA: Insufficient documentation

## 2013-10-06 DIAGNOSIS — Z87891 Personal history of nicotine dependence: Secondary | ICD-10-CM | POA: Insufficient documentation

## 2013-10-06 DIAGNOSIS — Z792 Long term (current) use of antibiotics: Secondary | ICD-10-CM | POA: Insufficient documentation

## 2013-10-06 DIAGNOSIS — Z79899 Other long term (current) drug therapy: Secondary | ICD-10-CM | POA: Insufficient documentation

## 2013-10-06 MED ORDER — HYDROCODONE-ACETAMINOPHEN 5-325 MG PO TABS: 1.0000 | ORAL_TABLET | Freq: Four times a day (QID) | ORAL | Status: AC | PRN

## 2013-10-06 MED ORDER — CYCLOBENZAPRINE HCL 5 MG PO TABS: 5.0000 mg | ORAL_TABLET | Freq: Two times a day (BID) | ORAL | Status: AC | PRN

## 2013-10-06 NOTE — Discharge Instructions (Signed)
Degenerative Disk Disease Degenerative disk disease is a condition caused by the changes that occur in the cushions of the backbone (spinal disks) as you grow older. Spinal disks are soft and compressible disks located between the bones of the spine (vertebrae). They act like shock absorbers. Degenerative disk disease can affect the whole spine. However, the neck and lower back are most commonly affected. Many changes can occur in the spinal disks with aging, such as:  The spinal disks may dry and shrink.  Small tears may occur in the tough, outer covering of the disk (annulus).  The disk space may become smaller due to loss of water.  Abnormal growths in the bone (spurs) may occur. This can put pressure on the nerve roots exiting the spinal canal, causing pain.  The spinal canal may become narrowed. CAUSES  Degenerative disk disease is a condition caused by the changes that occur in the spinal disks with aging. The exact cause is not known, but there is a genetic basis for many patients. Degenerative changes can occur due to loss of fluid in the disk. This makes the disk thinner and reduces the space between the backbones. Small cracks can develop in the outer layer of the disk. This can lead to the breakdown of the disk. You are more likely to get degenerative disk disease if you are overweight. Smoking cigarettes and doing heavy work such as weightlifting can also increase your risk of this condition. Degenerative changes can start after a sudden injury. Growth of bone spurs can compress the nerve roots and cause pain.  SYMPTOMS  The symptoms vary from person to person. Some people may have no pain, while others have severe pain. The pain may be so severe that it can limit your activities. The location of the pain depends on the part of your backbone that is affected. You will have neck or arm pain if a disk in the neck area is affected. You will have pain in your back, buttocks, or legs if a disk  in the lower back is affected. The pain becomes worse while bending, reaching up, or with twisting movements. The pain may start gradually and then get worse as time passes. It may also start after a major or minor injury. You may feel numbness or tingling in the arms or legs.  DIAGNOSIS  Your caregiver will ask you about your symptoms and about activities or habits that may cause the pain. He or she may also ask about any injuries, diseases, or treatments you have had earlier. Your caregiver will examine you to check for the range of movement that is possible in the affected area, to check for strength in your extremities, and to check for sensation in the areas of the arms and legs supplied by different nerve roots. An X-ray of the spine may be taken. Your caregiver may suggest other imaging tests, such as magnetic resonance imaging (MRI), if needed.  TREATMENT  Treatment includes rest, modifying your activities, and applying ice and heat. Your caregiver may prescribe medicines to reduce your pain and may ask you to do some exercises to strengthen your back. In some cases, you may need surgery. You and your caregiver will decide on the treatment that is best for you. HOME CARE INSTRUCTIONS   Follow proper lifting and walking techniques as advised by your caregiver.  Maintain good posture.  Exercise regularly as advised.  Perform relaxation exercises.  Change your sitting, standing, and sleeping habits as advised. Change positions  frequently.  Lose weight as advised.  Stop smoking if you smoke.  Wear supportive footwear. SEEK MEDICAL CARE IF:  Your pain does not go away within 1 to 4 weeks. SEEK IMMEDIATE MEDICAL CARE IF:   Your pain is severe.  You notice weakness in your arms, hands, or legs.  You begin to lose control of your bladder or bowel movements. MAKE SURE YOU:   Understand these instructions.  Will watch your condition.  Will get help right away if you are not doing  well or get worse. Document Released: 11/13/2006 Document Revised: 04/10/2011 Document Reviewed: 05/20/2013 [facility] Patient Information 2015 [place], [place]. This information is not intended to replace advice given to you by your health care provider. Make sure you discuss any questions you have with your health care provider.  Fingernail or Toenail Loss All or part of your fingernail or toenail has been lost. This may or may not grow back as a normal nail. A special non-stick bandage has been put on your finger or toe tightly to prevent bleeding. HOME CARE INSTRUCTIONS  The tips of fingers and toes are full of nerves and injuries are often very painful. The following will help you decrease the pain and obtain the best outcome.  Keep your hand or foot elevated above your heart to relieve pain and swelling. This will require lying in bed or on a couch with the hand or leg on pillows or sitting in a recliner with the leg up. Letting your hand or leg dangle may increase swelling, slow healing and cause throbbing pain.  Keep your dressing dry and clean.  Change your bandage in 24 hours after going home.  After your bandage is changed, soak your hand or foot in warm soapy water for 10 to 20 minutes. Do this 3 times per day. This helps reduce pain and swelling. After soaking, apply a clean, dry bandage. Change your bandage if it is wet or dirty.  Only take over-the-counter or prescription medicines for pain, discomfort, or fever as directed by your caregiver.  See your caregiver as needed for problems. SEEK IMMEDIATE MEDICAL CARE IF:   You have increased pain, swelling, drainage, or bleeding.  You have a fever. MAKE SURE YOU:   Understand these instructions.  Will watch your condition.  Will get help right away if you are not doing well or get worse. Document Released: 12/08/2005 Document Revised: 04/10/2011 Document Reviewed: 02/27/2006 [facility] Patient Information 2015 [place], [place].  This information is not intended to replace advice given to you by your health care provider. Make sure you discuss any questions you have with your health care provider.

## 2013-10-06 NOTE — ED Provider Notes (Signed)
Medical screening examination/treatment/procedure(s) were performed by non-physician practitioner and as supervising physician I was immediately available for consultation/collaboration.     Lenee Franze, MD 10/06/13 1503 

## 2013-10-06 NOTE — ED Notes (Signed)
Pt amb to room 7 with slow steady gait, favoring rle, post op in place. Pt reports bumping her right foot 2 weeks ago, hitting her great toe. Pt states she was seen here with xrays showing a fracture in 4th and 5th toes. Pt cont with pain to her great toe, toe around nailbed is swollen and tender. 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

## 2013-10-06 NOTE — ED Provider Notes (Signed)
CSN: [id number]     Arrival date & time 10/06/13  1053 History   First MD Initiated Contact with Patient 10/06/13 1106     Chief Complaint  Patient presents with  . Foot Pain     (Consider location/radiation/quality/duration/timing/severity/associated sxs/prior Treatment) HPI Comments: Pt states that she developed pain in her right great toe 2 weeks ago. Pt states that she hit her toe. She states that she was seen and told that she had fractured her 4th and 5th toes. Pt states that she noticed that she can almost lift her right great toe nail off. Pt states that she is also having spasms in her neck. She had and xray when she was seen in here the last time, but the results were not discussed. No numbness or weakness  The history is provided by the patient. No language interpreter was used.    Past Medical History  Diagnosis Date  . Thyroid disease   . Gout   . Arthritis   . Fibromyalgia   . MRSA (methicillin resistant Staphylococcus aureus)   . Diabetes mellitus without complication    Past Surgical History  Procedure Laterality Date  . Appendectomy    . Cholecystectomy    . Tonsillectomy     History reviewed. No pertinent family history. History  Substance Use Topics  . Smoking status: Former Games developer  . Smokeless tobacco: Not on file  . Alcohol Use: No   OB History   Grav Para Term Preterm Abortions TAB SAB Ect Mult Living                 Review of Systems  Constitutional: Negative.   Respiratory: Negative.   Cardiovascular: Negative.       Allergies  Almond oil; Apple; Avocado; Banana; Cabbage; Carrot; Eggs or egg-derived products; Milk-related compounds; Orange fruit; Peach; Shellfish allergy; Strawberry; Tomato; Yeast-related products; Ultracet; and Ultram  Home Medications   Prior to Admission medications   Medication Sig Start Date End Date Taking? Authorizing Provider  acyclovir (ZOVIRAX) 400 MG tablet Take 1 tablet (400 mg total) by mouth 3 (three) times  daily. 09/27/12   Rolan Bucco, MD  albuterol (PROVENTIL HFA;VENTOLIN HFA) 108 (90 BASE) MCG/ACT inhaler Inhale 2 puffs into the lungs every 6 (six) hours as needed. For wheezing or shortness of breath     Historical Provider, MD  Alcohol Swabs 70 % PADS 1 application by Does not apply route daily. 11/15/12   Nishant Dhungel, MD  azithromycin (ZITHROMAX) 500 MG tablet Take 1 tablet (500 mg total) by mouth daily. 10/22/12   Quentin Angst, MD  cephALEXin (KEFLEX) 500 MG capsule Take 1 capsule (500 mg total) by mouth 4 (four) times daily. 10/24/11   Derwood Kaplan, MD  ciprofloxacin (CIPRO) 500 MG tablet Take 1 tablet (500 mg total) by mouth 2 (two) times daily. 09/22/12   Kaitlyn Szekalski, PA-C  cyclobenzaprine (FLEXERIL) 10 MG tablet Take 10 mg by mouth 3 (three) times daily as needed. For muscle spasms      Historical Provider, MD  DULoxetine (CYMBALTA) 60 MG capsule Take 60 mg by mouth daily.      Historical Provider, MD  EPINEPHrine (EPIPEN) 0.3 mg/0.3 mL DEVI Inject 0.3 mg into the muscle once.    Historical Provider, MD  ferrous sulfate 325 (65 FE) MG tablet Take 325 mg by mouth daily.      Historical Provider, MD  fluticasone (FLONASE) 50 MCG/ACT nasal spray Place 2 sprays into both nostrils daily. 03/28/13  Doris Cheadle, MD  gabapentin (NEURONTIN) 300 MG capsule Take 300 mg by mouth 1 day or 1 dose.    Historical Provider, MD  glucose blood (ACCU-CHEK ADVANTAGE TEST) test strip Use as instructed 11/15/12   Nishant Dhungel, MD  guaiFENesin-dextromethorphan (ROBITUSSIN DM) 100-10 MG/5ML syrup Take 5 mLs by mouth 3 (three) times daily as needed for cough. 10/22/12   Quentin Angst, MD  HYDROcodone-acetaminophen (NORCO) 5-325 MG per tablet Take 1-2 tablets by mouth every 6 (six) hours as needed. 09/17/13   April K Palumbo-Rasch, MD  ibuprofen (ADVIL,MOTRIN) 800 MG tablet Take 1 tablet (800 mg total) by mouth 3 (three) times daily. 07/16/12   Elson Areas, PA-C  Lancets (ACCU-CHEK  MULTICLIX) lancets Use as instructed 11/15/12   Nishant Dhungel, MD  lansoprazole (PREVACID) 30 MG capsule Take 60 mg by mouth daily.      Historical Provider, MD  levothyroxine (SYNTHROID, LEVOTHROID) 150 MCG tablet Take 1 tablet (150 mcg total) by mouth daily. 11/15/12   Nishant Dhungel, MD  metFORMIN (GLUCOPHAGE) 500 MG tablet Take 1 tablet (500 mg total) by mouth 2 (two) times daily with a meal. 11/15/12   Nishant Dhungel, MD  methocarbamol (ROBAXIN) 500 MG tablet Take 1 tablet (500 mg total) by mouth 2 (two) times daily. 07/16/12   Elson Areas, PA-C  metoCLOPramide (REGLAN) 10 MG tablet Take 10 mg by mouth daily.      Historical Provider, MD  metroNIDAZOLE (FLAGYL) 500 MG tablet Take 1 tablet (500 mg total) by mouth 2 (two) times daily. One po bid x 7 days 09/27/12   Rolan Bucco, MD  omeprazole (PRILOSEC) 40 MG capsule Take 40 mg by mouth daily.    Historical Provider, MD  phentermine (ADIPEX-P) 37.5 MG tablet Take 37.5 mg by mouth daily before breakfast.    Historical Provider, MD  predniSONE (DELTASONE) 10 MG tablet Take 2 tablets (20 mg total) by mouth daily. 06/12/12   Hilario Quarry, MD  probenecid (BENEMID) 500 MG tablet Take 500 mg by mouth 2 (two) times daily.      Historical Provider, MD  promethazine (PHENERGAN) 25 MG tablet Take 1 tablet (25 mg total) by mouth every 6 (six) hours as needed for nausea. 09/22/12   Kaitlyn Szekalski, PA-C  sulfamethoxazole-trimethoprim (SEPTRA DS) 800-160 MG per tablet Take 2 tablets by mouth 2 (two) times daily. 10/24/11   Ankit Rhunette Croft, MD   BP 134/81  Pulse 74  Temp(Src) 98.6 F (37 C) (Oral)  Resp 18  Ht  (1.676 m)  Wt 238 lb (107.956 kg)  BMI 38.43 kg/m2  SpO2 100% Physical Exam  Nursing note and vitals reviewed. Constitutional: She is oriented to person, place, and time. She appears well-developed and well-nourished.  Cardiovascular: Normal rate and regular rhythm.   Pulmonary/Chest: Effort normal and breath sounds normal.   Musculoskeletal: Normal range of motion.  Left cervical paraspinal tenderness. Partial avulsion of the right great toenail without redness drainage or warmth  Neurological: She is alert and oriented to person, place, and time. She exhibits normal muscle tone. Coordination normal.  Skin: Skin is warm and dry.    ED Course  Procedures (including critical care time) Labs Review Labs Reviewed - No data to display  Imaging Review No results found.   EKG Interpretation None      MDM   Final diagnoses:  Nail avulsion of toe, initial encounter  Degenerative disc disease, cervical    No sign of infection with partially avulsed nail.  Don't think removal is warranted at this time.no sign of infection noted. Will treat for cervical spasms. neurovascularly intact    Teressa Lower, NP 10/06/13 1133
